# Patient Record
Sex: Male | Born: 1950 | Race: White | Hispanic: No | Marital: Married | Smoking: Former smoker
Health system: Southern US, Community
[De-identification: ages and names within clinical notes are randomized; demographics above are authoritative.]

## PROBLEM LIST (undated history)

## (undated) DIAGNOSIS — Z87442 Personal history of urinary calculi: Secondary | ICD-10-CM

## (undated) DIAGNOSIS — G4730 Sleep apnea, unspecified: Secondary | ICD-10-CM

## (undated) DIAGNOSIS — M1990 Unspecified osteoarthritis, unspecified site: Secondary | ICD-10-CM

## (undated) DIAGNOSIS — K219 Gastro-esophageal reflux disease without esophagitis: Secondary | ICD-10-CM

## (undated) DIAGNOSIS — E7800 Pure hypercholesterolemia, unspecified: Secondary | ICD-10-CM

## (undated) DIAGNOSIS — G20 Parkinson's disease: Secondary | ICD-10-CM

## (undated) HISTORY — PX: COLONOSCOPY: SHX174

---

## 2014-11-06 DIAGNOSIS — G20 Parkinson's disease: Secondary | ICD-10-CM | POA: Diagnosis not present

## 2014-12-07 DIAGNOSIS — F5221 Male erectile disorder: Secondary | ICD-10-CM | POA: Diagnosis not present

## 2014-12-07 DIAGNOSIS — E782 Mixed hyperlipidemia: Secondary | ICD-10-CM | POA: Diagnosis not present

## 2014-12-07 DIAGNOSIS — G20 Parkinson's disease: Secondary | ICD-10-CM | POA: Diagnosis not present

## 2014-12-07 DIAGNOSIS — K219 Gastro-esophageal reflux disease without esophagitis: Secondary | ICD-10-CM | POA: Diagnosis not present

## 2015-01-25 DIAGNOSIS — E782 Mixed hyperlipidemia: Secondary | ICD-10-CM | POA: Diagnosis not present

## 2015-01-25 DIAGNOSIS — K210 Gastro-esophageal reflux disease with esophagitis: Secondary | ICD-10-CM | POA: Diagnosis not present

## 2015-02-03 DIAGNOSIS — Z809 Family history of malignant neoplasm, unspecified: Secondary | ICD-10-CM | POA: Diagnosis not present

## 2015-02-03 DIAGNOSIS — Z87891 Personal history of nicotine dependence: Secondary | ICD-10-CM | POA: Diagnosis not present

## 2015-02-03 DIAGNOSIS — K219 Gastro-esophageal reflux disease without esophagitis: Secondary | ICD-10-CM | POA: Diagnosis not present

## 2015-02-03 DIAGNOSIS — Z7982 Long term (current) use of aspirin: Secondary | ICD-10-CM | POA: Diagnosis not present

## 2015-02-03 DIAGNOSIS — Z8249 Family history of ischemic heart disease and other diseases of the circulatory system: Secondary | ICD-10-CM | POA: Diagnosis not present

## 2015-02-03 DIAGNOSIS — Z6826 Body mass index (BMI) 26.0-26.9, adult: Secondary | ICD-10-CM | POA: Diagnosis not present

## 2015-02-03 DIAGNOSIS — G20 Parkinson's disease: Secondary | ICD-10-CM | POA: Diagnosis not present

## 2015-02-11 DIAGNOSIS — Z23 Encounter for immunization: Secondary | ICD-10-CM | POA: Diagnosis not present

## 2015-03-13 DIAGNOSIS — J019 Acute sinusitis, unspecified: Secondary | ICD-10-CM | POA: Diagnosis not present

## 2015-03-13 DIAGNOSIS — J069 Acute upper respiratory infection, unspecified: Secondary | ICD-10-CM | POA: Diagnosis not present

## 2015-04-14 DIAGNOSIS — G20 Parkinson's disease: Secondary | ICD-10-CM | POA: Diagnosis not present

## 2015-04-14 DIAGNOSIS — E782 Mixed hyperlipidemia: Secondary | ICD-10-CM | POA: Diagnosis not present

## 2015-04-14 DIAGNOSIS — K219 Gastro-esophageal reflux disease without esophagitis: Secondary | ICD-10-CM | POA: Diagnosis not present

## 2015-04-14 DIAGNOSIS — N183 Chronic kidney disease, stage 3 (moderate): Secondary | ICD-10-CM | POA: Diagnosis not present

## 2015-04-21 DIAGNOSIS — I517 Cardiomegaly: Secondary | ICD-10-CM | POA: Diagnosis not present

## 2015-04-21 DIAGNOSIS — Z87891 Personal history of nicotine dependence: Secondary | ICD-10-CM | POA: Diagnosis not present

## 2015-04-21 DIAGNOSIS — R0789 Other chest pain: Secondary | ICD-10-CM | POA: Diagnosis not present

## 2015-04-21 DIAGNOSIS — E7800 Pure hypercholesterolemia, unspecified: Secondary | ICD-10-CM | POA: Diagnosis not present

## 2015-05-25 DIAGNOSIS — J069 Acute upper respiratory infection, unspecified: Secondary | ICD-10-CM | POA: Diagnosis not present

## 2015-08-04 DIAGNOSIS — E7800 Pure hypercholesterolemia, unspecified: Secondary | ICD-10-CM | POA: Diagnosis not present

## 2015-08-04 DIAGNOSIS — Z87891 Personal history of nicotine dependence: Secondary | ICD-10-CM | POA: Diagnosis not present

## 2015-08-04 DIAGNOSIS — R0789 Other chest pain: Secondary | ICD-10-CM | POA: Diagnosis not present

## 2015-10-19 DIAGNOSIS — Z1212 Encounter for screening for malignant neoplasm of rectum: Secondary | ICD-10-CM | POA: Diagnosis not present

## 2015-10-19 DIAGNOSIS — K219 Gastro-esophageal reflux disease without esophagitis: Secondary | ICD-10-CM | POA: Diagnosis not present

## 2015-10-19 DIAGNOSIS — E782 Mixed hyperlipidemia: Secondary | ICD-10-CM | POA: Diagnosis not present

## 2015-10-19 DIAGNOSIS — G20 Parkinson's disease: Secondary | ICD-10-CM | POA: Diagnosis not present

## 2015-10-21 DIAGNOSIS — J069 Acute upper respiratory infection, unspecified: Secondary | ICD-10-CM | POA: Diagnosis not present

## 2016-03-01 DIAGNOSIS — G20 Parkinson's disease: Secondary | ICD-10-CM | POA: Diagnosis not present

## 2016-03-01 DIAGNOSIS — J0100 Acute maxillary sinusitis, unspecified: Secondary | ICD-10-CM | POA: Diagnosis not present

## 2016-03-01 DIAGNOSIS — Z1389 Encounter for screening for other disorder: Secondary | ICD-10-CM | POA: Diagnosis not present

## 2016-03-01 DIAGNOSIS — E782 Mixed hyperlipidemia: Secondary | ICD-10-CM | POA: Diagnosis not present

## 2016-03-01 DIAGNOSIS — Z6827 Body mass index (BMI) 27.0-27.9, adult: Secondary | ICD-10-CM | POA: Diagnosis not present

## 2016-03-01 DIAGNOSIS — Z23 Encounter for immunization: Secondary | ICD-10-CM | POA: Diagnosis not present

## 2016-03-01 DIAGNOSIS — S46011A Strain of muscle(s) and tendon(s) of the rotator cuff of right shoulder, initial encounter: Secondary | ICD-10-CM | POA: Diagnosis not present

## 2016-03-01 DIAGNOSIS — K219 Gastro-esophageal reflux disease without esophagitis: Secondary | ICD-10-CM | POA: Diagnosis not present

## 2016-07-14 DIAGNOSIS — J019 Acute sinusitis, unspecified: Secondary | ICD-10-CM | POA: Diagnosis not present

## 2016-07-14 DIAGNOSIS — J029 Acute pharyngitis, unspecified: Secondary | ICD-10-CM | POA: Diagnosis not present

## 2017-02-12 DIAGNOSIS — Z6827 Body mass index (BMI) 27.0-27.9, adult: Secondary | ICD-10-CM | POA: Diagnosis not present

## 2017-02-12 DIAGNOSIS — L03312 Cellulitis of back [any part except buttock]: Secondary | ICD-10-CM | POA: Diagnosis not present

## 2017-02-16 DIAGNOSIS — Z6826 Body mass index (BMI) 26.0-26.9, adult: Secondary | ICD-10-CM | POA: Diagnosis not present

## 2017-02-16 DIAGNOSIS — E782 Mixed hyperlipidemia: Secondary | ICD-10-CM | POA: Diagnosis not present

## 2017-02-16 DIAGNOSIS — G20 Parkinson's disease: Secondary | ICD-10-CM | POA: Diagnosis not present

## 2017-02-16 DIAGNOSIS — K210 Gastro-esophageal reflux disease with esophagitis: Secondary | ICD-10-CM | POA: Diagnosis not present

## 2017-02-16 DIAGNOSIS — Z0001 Encounter for general adult medical examination with abnormal findings: Secondary | ICD-10-CM | POA: Diagnosis not present

## 2017-02-20 DIAGNOSIS — E782 Mixed hyperlipidemia: Secondary | ICD-10-CM | POA: Diagnosis not present

## 2017-02-20 DIAGNOSIS — S46011A Strain of muscle(s) and tendon(s) of the rotator cuff of right shoulder, initial encounter: Secondary | ICD-10-CM | POA: Diagnosis not present

## 2017-02-20 DIAGNOSIS — Z23 Encounter for immunization: Secondary | ICD-10-CM | POA: Diagnosis not present

## 2017-02-20 DIAGNOSIS — Z1212 Encounter for screening for malignant neoplasm of rectum: Secondary | ICD-10-CM | POA: Diagnosis not present

## 2017-02-20 DIAGNOSIS — K219 Gastro-esophageal reflux disease without esophagitis: Secondary | ICD-10-CM | POA: Diagnosis not present

## 2017-02-20 DIAGNOSIS — G20 Parkinson's disease: Secondary | ICD-10-CM | POA: Diagnosis not present

## 2017-02-20 DIAGNOSIS — L570 Actinic keratosis: Secondary | ICD-10-CM | POA: Diagnosis not present

## 2017-02-20 DIAGNOSIS — Z0001 Encounter for general adult medical examination with abnormal findings: Secondary | ICD-10-CM | POA: Diagnosis not present

## 2017-02-23 DIAGNOSIS — L723 Sebaceous cyst: Secondary | ICD-10-CM | POA: Diagnosis not present

## 2017-08-13 DIAGNOSIS — G20 Parkinson's disease: Secondary | ICD-10-CM | POA: Diagnosis not present

## 2017-08-13 DIAGNOSIS — N183 Chronic kidney disease, stage 3 (moderate): Secondary | ICD-10-CM | POA: Diagnosis not present

## 2017-08-13 DIAGNOSIS — G4733 Obstructive sleep apnea (adult) (pediatric): Secondary | ICD-10-CM | POA: Diagnosis not present

## 2017-08-13 DIAGNOSIS — E782 Mixed hyperlipidemia: Secondary | ICD-10-CM | POA: Diagnosis not present

## 2017-08-13 DIAGNOSIS — Z9189 Other specified personal risk factors, not elsewhere classified: Secondary | ICD-10-CM | POA: Diagnosis not present

## 2017-08-13 DIAGNOSIS — K210 Gastro-esophageal reflux disease with esophagitis: Secondary | ICD-10-CM | POA: Diagnosis not present

## 2017-08-23 DIAGNOSIS — K219 Gastro-esophageal reflux disease without esophagitis: Secondary | ICD-10-CM | POA: Diagnosis not present

## 2017-08-23 DIAGNOSIS — E782 Mixed hyperlipidemia: Secondary | ICD-10-CM | POA: Diagnosis not present

## 2017-08-23 DIAGNOSIS — Z6827 Body mass index (BMI) 27.0-27.9, adult: Secondary | ICD-10-CM | POA: Diagnosis not present

## 2017-08-23 DIAGNOSIS — G20 Parkinson's disease: Secondary | ICD-10-CM | POA: Diagnosis not present

## 2017-08-23 DIAGNOSIS — L570 Actinic keratosis: Secondary | ICD-10-CM | POA: Diagnosis not present

## 2017-08-23 DIAGNOSIS — H9313 Tinnitus, bilateral: Secondary | ICD-10-CM | POA: Diagnosis not present

## 2017-08-26 DIAGNOSIS — S0501XA Injury of conjunctiva and corneal abrasion without foreign body, right eye, initial encounter: Secondary | ICD-10-CM | POA: Diagnosis not present

## 2017-08-26 DIAGNOSIS — Y998 Other external cause status: Secondary | ICD-10-CM | POA: Diagnosis not present

## 2017-09-20 DIAGNOSIS — Z6826 Body mass index (BMI) 26.0-26.9, adult: Secondary | ICD-10-CM | POA: Diagnosis not present

## 2017-09-20 DIAGNOSIS — S40022A Contusion of left upper arm, initial encounter: Secondary | ICD-10-CM | POA: Diagnosis not present

## 2018-01-25 DIAGNOSIS — R350 Frequency of micturition: Secondary | ICD-10-CM | POA: Diagnosis not present

## 2018-01-25 DIAGNOSIS — J0101 Acute recurrent maxillary sinusitis: Secondary | ICD-10-CM | POA: Diagnosis not present

## 2018-01-25 DIAGNOSIS — Z6826 Body mass index (BMI) 26.0-26.9, adult: Secondary | ICD-10-CM | POA: Diagnosis not present

## 2018-05-29 HISTORY — PX: ROTATOR CUFF REPAIR: SHX139

## 2018-09-09 DIAGNOSIS — M25512 Pain in left shoulder: Secondary | ICD-10-CM | POA: Diagnosis not present

## 2018-09-09 DIAGNOSIS — Z6826 Body mass index (BMI) 26.0-26.9, adult: Secondary | ICD-10-CM | POA: Diagnosis not present

## 2018-09-09 DIAGNOSIS — M7552 Bursitis of left shoulder: Secondary | ICD-10-CM | POA: Diagnosis not present

## 2018-09-19 DIAGNOSIS — S46812A Strain of other muscles, fascia and tendons at shoulder and upper arm level, left arm, initial encounter: Secondary | ICD-10-CM | POA: Diagnosis not present

## 2018-09-19 DIAGNOSIS — M75112 Incomplete rotator cuff tear or rupture of left shoulder, not specified as traumatic: Secondary | ICD-10-CM | POA: Diagnosis not present

## 2018-09-19 DIAGNOSIS — M7552 Bursitis of left shoulder: Secondary | ICD-10-CM | POA: Diagnosis not present

## 2018-09-19 DIAGNOSIS — M25512 Pain in left shoulder: Secondary | ICD-10-CM | POA: Diagnosis not present

## 2018-09-25 DIAGNOSIS — M25512 Pain in left shoulder: Secondary | ICD-10-CM | POA: Diagnosis not present

## 2018-09-30 DIAGNOSIS — M62512 Muscle wasting and atrophy, not elsewhere classified, left shoulder: Secondary | ICD-10-CM | POA: Diagnosis not present

## 2018-09-30 DIAGNOSIS — M25612 Stiffness of left shoulder, not elsewhere classified: Secondary | ICD-10-CM | POA: Diagnosis not present

## 2018-09-30 DIAGNOSIS — M25512 Pain in left shoulder: Secondary | ICD-10-CM | POA: Diagnosis not present

## 2018-10-02 DIAGNOSIS — M25612 Stiffness of left shoulder, not elsewhere classified: Secondary | ICD-10-CM | POA: Diagnosis not present

## 2018-10-02 DIAGNOSIS — M25512 Pain in left shoulder: Secondary | ICD-10-CM | POA: Diagnosis not present

## 2018-10-02 DIAGNOSIS — M62512 Muscle wasting and atrophy, not elsewhere classified, left shoulder: Secondary | ICD-10-CM | POA: Diagnosis not present

## 2018-10-08 DIAGNOSIS — M25512 Pain in left shoulder: Secondary | ICD-10-CM | POA: Diagnosis not present

## 2018-10-08 DIAGNOSIS — M25612 Stiffness of left shoulder, not elsewhere classified: Secondary | ICD-10-CM | POA: Diagnosis not present

## 2018-10-08 DIAGNOSIS — M62512 Muscle wasting and atrophy, not elsewhere classified, left shoulder: Secondary | ICD-10-CM | POA: Diagnosis not present

## 2018-10-10 DIAGNOSIS — M25512 Pain in left shoulder: Secondary | ICD-10-CM | POA: Diagnosis not present

## 2018-10-10 DIAGNOSIS — M25612 Stiffness of left shoulder, not elsewhere classified: Secondary | ICD-10-CM | POA: Diagnosis not present

## 2018-10-10 DIAGNOSIS — M62512 Muscle wasting and atrophy, not elsewhere classified, left shoulder: Secondary | ICD-10-CM | POA: Diagnosis not present

## 2018-10-15 DIAGNOSIS — M62512 Muscle wasting and atrophy, not elsewhere classified, left shoulder: Secondary | ICD-10-CM | POA: Diagnosis not present

## 2018-10-15 DIAGNOSIS — M25612 Stiffness of left shoulder, not elsewhere classified: Secondary | ICD-10-CM | POA: Diagnosis not present

## 2018-10-15 DIAGNOSIS — M25512 Pain in left shoulder: Secondary | ICD-10-CM | POA: Diagnosis not present

## 2018-10-17 DIAGNOSIS — M25612 Stiffness of left shoulder, not elsewhere classified: Secondary | ICD-10-CM | POA: Diagnosis not present

## 2018-10-17 DIAGNOSIS — M62512 Muscle wasting and atrophy, not elsewhere classified, left shoulder: Secondary | ICD-10-CM | POA: Diagnosis not present

## 2018-10-17 DIAGNOSIS — M25512 Pain in left shoulder: Secondary | ICD-10-CM | POA: Diagnosis not present

## 2018-10-25 DIAGNOSIS — M25512 Pain in left shoulder: Secondary | ICD-10-CM | POA: Diagnosis not present

## 2018-10-29 DIAGNOSIS — M25612 Stiffness of left shoulder, not elsewhere classified: Secondary | ICD-10-CM | POA: Diagnosis not present

## 2018-10-29 DIAGNOSIS — M62512 Muscle wasting and atrophy, not elsewhere classified, left shoulder: Secondary | ICD-10-CM | POA: Diagnosis not present

## 2018-10-31 DIAGNOSIS — M25612 Stiffness of left shoulder, not elsewhere classified: Secondary | ICD-10-CM | POA: Diagnosis not present

## 2018-10-31 DIAGNOSIS — M62512 Muscle wasting and atrophy, not elsewhere classified, left shoulder: Secondary | ICD-10-CM | POA: Diagnosis not present

## 2018-11-07 DIAGNOSIS — M62512 Muscle wasting and atrophy, not elsewhere classified, left shoulder: Secondary | ICD-10-CM | POA: Diagnosis not present

## 2018-11-07 DIAGNOSIS — M25612 Stiffness of left shoulder, not elsewhere classified: Secondary | ICD-10-CM | POA: Diagnosis not present

## 2018-11-12 DIAGNOSIS — M62512 Muscle wasting and atrophy, not elsewhere classified, left shoulder: Secondary | ICD-10-CM | POA: Diagnosis not present

## 2018-11-12 DIAGNOSIS — M25612 Stiffness of left shoulder, not elsewhere classified: Secondary | ICD-10-CM | POA: Diagnosis not present

## 2018-11-14 DIAGNOSIS — M62512 Muscle wasting and atrophy, not elsewhere classified, left shoulder: Secondary | ICD-10-CM | POA: Diagnosis not present

## 2018-11-14 DIAGNOSIS — M25612 Stiffness of left shoulder, not elsewhere classified: Secondary | ICD-10-CM | POA: Diagnosis not present

## 2018-11-19 DIAGNOSIS — M25612 Stiffness of left shoulder, not elsewhere classified: Secondary | ICD-10-CM | POA: Diagnosis not present

## 2018-11-19 DIAGNOSIS — M62512 Muscle wasting and atrophy, not elsewhere classified, left shoulder: Secondary | ICD-10-CM | POA: Diagnosis not present

## 2018-11-21 DIAGNOSIS — M25612 Stiffness of left shoulder, not elsewhere classified: Secondary | ICD-10-CM | POA: Diagnosis not present

## 2018-11-21 DIAGNOSIS — M62512 Muscle wasting and atrophy, not elsewhere classified, left shoulder: Secondary | ICD-10-CM | POA: Diagnosis not present

## 2018-12-04 DIAGNOSIS — M62512 Muscle wasting and atrophy, not elsewhere classified, left shoulder: Secondary | ICD-10-CM | POA: Diagnosis not present

## 2018-12-04 DIAGNOSIS — M25512 Pain in left shoulder: Secondary | ICD-10-CM | POA: Diagnosis not present

## 2018-12-04 DIAGNOSIS — M25612 Stiffness of left shoulder, not elsewhere classified: Secondary | ICD-10-CM | POA: Diagnosis not present

## 2018-12-06 DIAGNOSIS — M25612 Stiffness of left shoulder, not elsewhere classified: Secondary | ICD-10-CM | POA: Diagnosis not present

## 2018-12-06 DIAGNOSIS — M25512 Pain in left shoulder: Secondary | ICD-10-CM | POA: Diagnosis not present

## 2018-12-06 DIAGNOSIS — M62512 Muscle wasting and atrophy, not elsewhere classified, left shoulder: Secondary | ICD-10-CM | POA: Diagnosis not present

## 2018-12-09 DIAGNOSIS — M25512 Pain in left shoulder: Secondary | ICD-10-CM | POA: Diagnosis not present

## 2018-12-17 DIAGNOSIS — M25512 Pain in left shoulder: Secondary | ICD-10-CM | POA: Diagnosis not present

## 2018-12-17 DIAGNOSIS — M25612 Stiffness of left shoulder, not elsewhere classified: Secondary | ICD-10-CM | POA: Diagnosis not present

## 2018-12-17 DIAGNOSIS — M62512 Muscle wasting and atrophy, not elsewhere classified, left shoulder: Secondary | ICD-10-CM | POA: Diagnosis not present

## 2018-12-19 DIAGNOSIS — M25612 Stiffness of left shoulder, not elsewhere classified: Secondary | ICD-10-CM | POA: Diagnosis not present

## 2018-12-19 DIAGNOSIS — M25512 Pain in left shoulder: Secondary | ICD-10-CM | POA: Diagnosis not present

## 2018-12-19 DIAGNOSIS — M62512 Muscle wasting and atrophy, not elsewhere classified, left shoulder: Secondary | ICD-10-CM | POA: Diagnosis not present

## 2018-12-24 DIAGNOSIS — M62512 Muscle wasting and atrophy, not elsewhere classified, left shoulder: Secondary | ICD-10-CM | POA: Diagnosis not present

## 2018-12-24 DIAGNOSIS — M25612 Stiffness of left shoulder, not elsewhere classified: Secondary | ICD-10-CM | POA: Diagnosis not present

## 2018-12-24 DIAGNOSIS — M25512 Pain in left shoulder: Secondary | ICD-10-CM | POA: Diagnosis not present

## 2018-12-26 DIAGNOSIS — M62512 Muscle wasting and atrophy, not elsewhere classified, left shoulder: Secondary | ICD-10-CM | POA: Diagnosis not present

## 2018-12-26 DIAGNOSIS — M25612 Stiffness of left shoulder, not elsewhere classified: Secondary | ICD-10-CM | POA: Diagnosis not present

## 2018-12-26 DIAGNOSIS — M25512 Pain in left shoulder: Secondary | ICD-10-CM | POA: Diagnosis not present

## 2019-01-02 DIAGNOSIS — M25512 Pain in left shoulder: Secondary | ICD-10-CM | POA: Diagnosis not present

## 2019-01-02 DIAGNOSIS — M62512 Muscle wasting and atrophy, not elsewhere classified, left shoulder: Secondary | ICD-10-CM | POA: Diagnosis not present

## 2019-01-02 DIAGNOSIS — M25612 Stiffness of left shoulder, not elsewhere classified: Secondary | ICD-10-CM | POA: Diagnosis not present

## 2019-01-06 DIAGNOSIS — M25512 Pain in left shoulder: Secondary | ICD-10-CM | POA: Diagnosis not present

## 2019-01-07 DIAGNOSIS — M25612 Stiffness of left shoulder, not elsewhere classified: Secondary | ICD-10-CM | POA: Diagnosis not present

## 2019-01-07 DIAGNOSIS — M25512 Pain in left shoulder: Secondary | ICD-10-CM | POA: Diagnosis not present

## 2019-01-07 DIAGNOSIS — M62512 Muscle wasting and atrophy, not elsewhere classified, left shoulder: Secondary | ICD-10-CM | POA: Diagnosis not present

## 2019-01-09 DIAGNOSIS — M25512 Pain in left shoulder: Secondary | ICD-10-CM | POA: Diagnosis not present

## 2019-01-09 DIAGNOSIS — M62512 Muscle wasting and atrophy, not elsewhere classified, left shoulder: Secondary | ICD-10-CM | POA: Diagnosis not present

## 2019-01-09 DIAGNOSIS — M25612 Stiffness of left shoulder, not elsewhere classified: Secondary | ICD-10-CM | POA: Diagnosis not present

## 2019-01-14 DIAGNOSIS — M25612 Stiffness of left shoulder, not elsewhere classified: Secondary | ICD-10-CM | POA: Diagnosis not present

## 2019-01-14 DIAGNOSIS — M62512 Muscle wasting and atrophy, not elsewhere classified, left shoulder: Secondary | ICD-10-CM | POA: Diagnosis not present

## 2019-01-14 DIAGNOSIS — M25512 Pain in left shoulder: Secondary | ICD-10-CM | POA: Diagnosis not present

## 2019-01-16 DIAGNOSIS — M62512 Muscle wasting and atrophy, not elsewhere classified, left shoulder: Secondary | ICD-10-CM | POA: Diagnosis not present

## 2019-01-16 DIAGNOSIS — M25612 Stiffness of left shoulder, not elsewhere classified: Secondary | ICD-10-CM | POA: Diagnosis not present

## 2019-01-16 DIAGNOSIS — M25512 Pain in left shoulder: Secondary | ICD-10-CM | POA: Diagnosis not present

## 2019-01-21 DIAGNOSIS — K210 Gastro-esophageal reflux disease with esophagitis: Secondary | ICD-10-CM | POA: Diagnosis not present

## 2019-01-21 DIAGNOSIS — E782 Mixed hyperlipidemia: Secondary | ICD-10-CM | POA: Diagnosis not present

## 2019-01-21 DIAGNOSIS — G4733 Obstructive sleep apnea (adult) (pediatric): Secondary | ICD-10-CM | POA: Diagnosis not present

## 2019-01-21 DIAGNOSIS — N183 Chronic kidney disease, stage 3 (moderate): Secondary | ICD-10-CM | POA: Diagnosis not present

## 2019-01-21 DIAGNOSIS — G20 Parkinson's disease: Secondary | ICD-10-CM | POA: Diagnosis not present

## 2019-01-29 DIAGNOSIS — G20 Parkinson's disease: Secondary | ICD-10-CM | POA: Diagnosis not present

## 2019-01-29 DIAGNOSIS — K219 Gastro-esophageal reflux disease without esophagitis: Secondary | ICD-10-CM | POA: Diagnosis not present

## 2019-01-29 DIAGNOSIS — Z23 Encounter for immunization: Secondary | ICD-10-CM | POA: Diagnosis not present

## 2019-01-29 DIAGNOSIS — H9313 Tinnitus, bilateral: Secondary | ICD-10-CM | POA: Diagnosis not present

## 2019-01-29 DIAGNOSIS — Z1212 Encounter for screening for malignant neoplasm of rectum: Secondary | ICD-10-CM | POA: Diagnosis not present

## 2019-01-29 DIAGNOSIS — Z6826 Body mass index (BMI) 26.0-26.9, adult: Secondary | ICD-10-CM | POA: Diagnosis not present

## 2019-01-29 DIAGNOSIS — E782 Mixed hyperlipidemia: Secondary | ICD-10-CM | POA: Diagnosis not present

## 2019-01-29 DIAGNOSIS — Z0001 Encounter for general adult medical examination with abnormal findings: Secondary | ICD-10-CM | POA: Diagnosis not present

## 2019-02-17 DIAGNOSIS — M9901 Segmental and somatic dysfunction of cervical region: Secondary | ICD-10-CM | POA: Diagnosis not present

## 2019-02-17 DIAGNOSIS — M9902 Segmental and somatic dysfunction of thoracic region: Secondary | ICD-10-CM | POA: Diagnosis not present

## 2019-02-20 DIAGNOSIS — M9901 Segmental and somatic dysfunction of cervical region: Secondary | ICD-10-CM | POA: Diagnosis not present

## 2019-02-20 DIAGNOSIS — M9902 Segmental and somatic dysfunction of thoracic region: Secondary | ICD-10-CM | POA: Diagnosis not present

## 2019-02-24 DIAGNOSIS — M9901 Segmental and somatic dysfunction of cervical region: Secondary | ICD-10-CM | POA: Diagnosis not present

## 2019-02-24 DIAGNOSIS — M9902 Segmental and somatic dysfunction of thoracic region: Secondary | ICD-10-CM | POA: Diagnosis not present

## 2019-02-27 DIAGNOSIS — M9902 Segmental and somatic dysfunction of thoracic region: Secondary | ICD-10-CM | POA: Diagnosis not present

## 2019-02-27 DIAGNOSIS — M9901 Segmental and somatic dysfunction of cervical region: Secondary | ICD-10-CM | POA: Diagnosis not present

## 2019-03-03 DIAGNOSIS — M9901 Segmental and somatic dysfunction of cervical region: Secondary | ICD-10-CM | POA: Diagnosis not present

## 2019-03-03 DIAGNOSIS — M9902 Segmental and somatic dysfunction of thoracic region: Secondary | ICD-10-CM | POA: Diagnosis not present

## 2019-03-06 DIAGNOSIS — M9902 Segmental and somatic dysfunction of thoracic region: Secondary | ICD-10-CM | POA: Diagnosis not present

## 2019-03-06 DIAGNOSIS — M9901 Segmental and somatic dysfunction of cervical region: Secondary | ICD-10-CM | POA: Diagnosis not present

## 2019-03-10 DIAGNOSIS — M9902 Segmental and somatic dysfunction of thoracic region: Secondary | ICD-10-CM | POA: Diagnosis not present

## 2019-03-10 DIAGNOSIS — M9901 Segmental and somatic dysfunction of cervical region: Secondary | ICD-10-CM | POA: Diagnosis not present

## 2019-03-13 DIAGNOSIS — M9902 Segmental and somatic dysfunction of thoracic region: Secondary | ICD-10-CM | POA: Diagnosis not present

## 2019-03-13 DIAGNOSIS — M9901 Segmental and somatic dysfunction of cervical region: Secondary | ICD-10-CM | POA: Diagnosis not present

## 2019-03-14 DIAGNOSIS — G5602 Carpal tunnel syndrome, left upper limb: Secondary | ICD-10-CM | POA: Diagnosis not present

## 2019-03-14 DIAGNOSIS — M25512 Pain in left shoulder: Secondary | ICD-10-CM | POA: Diagnosis not present

## 2019-03-25 DIAGNOSIS — L570 Actinic keratosis: Secondary | ICD-10-CM | POA: Diagnosis not present

## 2019-04-16 DIAGNOSIS — S4382XA Sprain of other specified parts of left shoulder girdle, initial encounter: Secondary | ICD-10-CM | POA: Diagnosis not present

## 2019-04-21 DIAGNOSIS — G20 Parkinson's disease: Secondary | ICD-10-CM | POA: Diagnosis not present

## 2019-04-21 DIAGNOSIS — H9313 Tinnitus, bilateral: Secondary | ICD-10-CM | POA: Diagnosis not present

## 2019-04-21 DIAGNOSIS — E782 Mixed hyperlipidemia: Secondary | ICD-10-CM | POA: Diagnosis not present

## 2019-04-21 DIAGNOSIS — L570 Actinic keratosis: Secondary | ICD-10-CM | POA: Diagnosis not present

## 2019-04-21 DIAGNOSIS — K219 Gastro-esophageal reflux disease without esophagitis: Secondary | ICD-10-CM | POA: Diagnosis not present

## 2019-04-28 DIAGNOSIS — E782 Mixed hyperlipidemia: Secondary | ICD-10-CM | POA: Diagnosis not present

## 2019-04-28 DIAGNOSIS — K219 Gastro-esophageal reflux disease without esophagitis: Secondary | ICD-10-CM | POA: Diagnosis not present

## 2019-05-08 DIAGNOSIS — M75122 Complete rotator cuff tear or rupture of left shoulder, not specified as traumatic: Secondary | ICD-10-CM | POA: Diagnosis not present

## 2019-05-08 DIAGNOSIS — S43432A Superior glenoid labrum lesion of left shoulder, initial encounter: Secondary | ICD-10-CM | POA: Diagnosis not present

## 2019-05-08 DIAGNOSIS — G8918 Other acute postprocedural pain: Secondary | ICD-10-CM | POA: Diagnosis not present

## 2019-05-08 DIAGNOSIS — S46012A Strain of muscle(s) and tendon(s) of the rotator cuff of left shoulder, initial encounter: Secondary | ICD-10-CM | POA: Diagnosis not present

## 2019-05-08 DIAGNOSIS — M24112 Other articular cartilage disorders, left shoulder: Secondary | ICD-10-CM | POA: Diagnosis not present

## 2019-05-08 DIAGNOSIS — M948X1 Other specified disorders of cartilage, shoulder: Secondary | ICD-10-CM | POA: Diagnosis not present

## 2019-05-08 DIAGNOSIS — M7522 Bicipital tendinitis, left shoulder: Secondary | ICD-10-CM | POA: Diagnosis not present

## 2019-05-08 DIAGNOSIS — M7542 Impingement syndrome of left shoulder: Secondary | ICD-10-CM | POA: Diagnosis not present

## 2019-05-19 DIAGNOSIS — M24112 Other articular cartilage disorders, left shoulder: Secondary | ICD-10-CM | POA: Diagnosis not present

## 2019-05-29 DIAGNOSIS — K219 Gastro-esophageal reflux disease without esophagitis: Secondary | ICD-10-CM | POA: Diagnosis not present

## 2019-05-29 DIAGNOSIS — N183 Chronic kidney disease, stage 3 unspecified: Secondary | ICD-10-CM | POA: Diagnosis not present

## 2019-05-29 DIAGNOSIS — G20 Parkinson's disease: Secondary | ICD-10-CM | POA: Diagnosis not present

## 2019-05-29 DIAGNOSIS — E782 Mixed hyperlipidemia: Secondary | ICD-10-CM | POA: Diagnosis not present

## 2019-06-03 DIAGNOSIS — K219 Gastro-esophageal reflux disease without esophagitis: Secondary | ICD-10-CM | POA: Diagnosis not present

## 2019-06-03 DIAGNOSIS — Z6826 Body mass index (BMI) 26.0-26.9, adult: Secondary | ICD-10-CM | POA: Diagnosis not present

## 2019-06-03 DIAGNOSIS — G20 Parkinson's disease: Secondary | ICD-10-CM | POA: Diagnosis not present

## 2019-06-03 DIAGNOSIS — Z23 Encounter for immunization: Secondary | ICD-10-CM | POA: Diagnosis not present

## 2019-06-03 DIAGNOSIS — E782 Mixed hyperlipidemia: Secondary | ICD-10-CM | POA: Diagnosis not present

## 2019-06-03 DIAGNOSIS — H9313 Tinnitus, bilateral: Secondary | ICD-10-CM | POA: Diagnosis not present

## 2019-06-18 DIAGNOSIS — M25511 Pain in right shoulder: Secondary | ICD-10-CM | POA: Diagnosis not present

## 2019-06-18 DIAGNOSIS — Z4789 Encounter for other orthopedic aftercare: Secondary | ICD-10-CM | POA: Diagnosis not present

## 2019-06-20 DIAGNOSIS — M25511 Pain in right shoulder: Secondary | ICD-10-CM | POA: Diagnosis not present

## 2019-06-20 DIAGNOSIS — Z4789 Encounter for other orthopedic aftercare: Secondary | ICD-10-CM | POA: Diagnosis not present

## 2019-06-25 DIAGNOSIS — Z4789 Encounter for other orthopedic aftercare: Secondary | ICD-10-CM | POA: Diagnosis not present

## 2019-06-25 DIAGNOSIS — M25511 Pain in right shoulder: Secondary | ICD-10-CM | POA: Diagnosis not present

## 2019-06-27 DIAGNOSIS — E7849 Other hyperlipidemia: Secondary | ICD-10-CM | POA: Diagnosis not present

## 2019-06-27 DIAGNOSIS — M25511 Pain in right shoulder: Secondary | ICD-10-CM | POA: Diagnosis not present

## 2019-06-27 DIAGNOSIS — Z4789 Encounter for other orthopedic aftercare: Secondary | ICD-10-CM | POA: Diagnosis not present

## 2019-06-27 DIAGNOSIS — G20 Parkinson's disease: Secondary | ICD-10-CM | POA: Diagnosis not present

## 2019-07-02 DIAGNOSIS — Z4789 Encounter for other orthopedic aftercare: Secondary | ICD-10-CM | POA: Diagnosis not present

## 2019-07-02 DIAGNOSIS — M25511 Pain in right shoulder: Secondary | ICD-10-CM | POA: Diagnosis not present

## 2019-07-04 DIAGNOSIS — M25511 Pain in right shoulder: Secondary | ICD-10-CM | POA: Diagnosis not present

## 2019-07-04 DIAGNOSIS — Z4789 Encounter for other orthopedic aftercare: Secondary | ICD-10-CM | POA: Diagnosis not present

## 2019-07-09 DIAGNOSIS — M25511 Pain in right shoulder: Secondary | ICD-10-CM | POA: Diagnosis not present

## 2019-07-09 DIAGNOSIS — Z4789 Encounter for other orthopedic aftercare: Secondary | ICD-10-CM | POA: Diagnosis not present

## 2019-07-11 DIAGNOSIS — Z4789 Encounter for other orthopedic aftercare: Secondary | ICD-10-CM | POA: Diagnosis not present

## 2019-07-11 DIAGNOSIS — M25511 Pain in right shoulder: Secondary | ICD-10-CM | POA: Diagnosis not present

## 2019-07-16 DIAGNOSIS — Z4789 Encounter for other orthopedic aftercare: Secondary | ICD-10-CM | POA: Diagnosis not present

## 2019-07-16 DIAGNOSIS — M25511 Pain in right shoulder: Secondary | ICD-10-CM | POA: Diagnosis not present

## 2019-07-18 DIAGNOSIS — Z4789 Encounter for other orthopedic aftercare: Secondary | ICD-10-CM | POA: Diagnosis not present

## 2019-07-18 DIAGNOSIS — M25511 Pain in right shoulder: Secondary | ICD-10-CM | POA: Diagnosis not present

## 2019-07-23 DIAGNOSIS — Z4789 Encounter for other orthopedic aftercare: Secondary | ICD-10-CM | POA: Diagnosis not present

## 2019-07-23 DIAGNOSIS — M25511 Pain in right shoulder: Secondary | ICD-10-CM | POA: Diagnosis not present

## 2019-07-25 DIAGNOSIS — M25511 Pain in right shoulder: Secondary | ICD-10-CM | POA: Diagnosis not present

## 2019-07-25 DIAGNOSIS — Z4789 Encounter for other orthopedic aftercare: Secondary | ICD-10-CM | POA: Diagnosis not present

## 2019-07-30 DIAGNOSIS — Z4789 Encounter for other orthopedic aftercare: Secondary | ICD-10-CM | POA: Diagnosis not present

## 2019-07-30 DIAGNOSIS — M25511 Pain in right shoulder: Secondary | ICD-10-CM | POA: Diagnosis not present

## 2019-08-01 DIAGNOSIS — M25511 Pain in right shoulder: Secondary | ICD-10-CM | POA: Diagnosis not present

## 2019-08-01 DIAGNOSIS — Z4789 Encounter for other orthopedic aftercare: Secondary | ICD-10-CM | POA: Diagnosis not present

## 2019-08-06 DIAGNOSIS — M25511 Pain in right shoulder: Secondary | ICD-10-CM | POA: Diagnosis not present

## 2019-08-06 DIAGNOSIS — Z4789 Encounter for other orthopedic aftercare: Secondary | ICD-10-CM | POA: Diagnosis not present

## 2019-08-08 DIAGNOSIS — Z4789 Encounter for other orthopedic aftercare: Secondary | ICD-10-CM | POA: Diagnosis not present

## 2019-08-08 DIAGNOSIS — M25512 Pain in left shoulder: Secondary | ICD-10-CM | POA: Diagnosis not present

## 2019-08-11 DIAGNOSIS — S46012D Strain of muscle(s) and tendon(s) of the rotator cuff of left shoulder, subsequent encounter: Secondary | ICD-10-CM | POA: Diagnosis not present

## 2019-08-13 DIAGNOSIS — Z4789 Encounter for other orthopedic aftercare: Secondary | ICD-10-CM | POA: Diagnosis not present

## 2019-08-13 DIAGNOSIS — M25512 Pain in left shoulder: Secondary | ICD-10-CM | POA: Diagnosis not present

## 2019-08-15 DIAGNOSIS — Z4789 Encounter for other orthopedic aftercare: Secondary | ICD-10-CM | POA: Diagnosis not present

## 2019-08-15 DIAGNOSIS — M25512 Pain in left shoulder: Secondary | ICD-10-CM | POA: Diagnosis not present

## 2019-08-20 DIAGNOSIS — Z4789 Encounter for other orthopedic aftercare: Secondary | ICD-10-CM | POA: Diagnosis not present

## 2019-08-20 DIAGNOSIS — M25512 Pain in left shoulder: Secondary | ICD-10-CM | POA: Diagnosis not present

## 2019-08-22 DIAGNOSIS — Z4789 Encounter for other orthopedic aftercare: Secondary | ICD-10-CM | POA: Diagnosis not present

## 2019-08-22 DIAGNOSIS — M25512 Pain in left shoulder: Secondary | ICD-10-CM | POA: Diagnosis not present

## 2019-08-27 DIAGNOSIS — G20 Parkinson's disease: Secondary | ICD-10-CM | POA: Diagnosis not present

## 2019-08-27 DIAGNOSIS — E7849 Other hyperlipidemia: Secondary | ICD-10-CM | POA: Diagnosis not present

## 2019-08-29 DIAGNOSIS — Z4789 Encounter for other orthopedic aftercare: Secondary | ICD-10-CM | POA: Diagnosis not present

## 2019-08-29 DIAGNOSIS — M25512 Pain in left shoulder: Secondary | ICD-10-CM | POA: Diagnosis not present

## 2019-09-03 DIAGNOSIS — M25512 Pain in left shoulder: Secondary | ICD-10-CM | POA: Diagnosis not present

## 2019-09-03 DIAGNOSIS — Z4789 Encounter for other orthopedic aftercare: Secondary | ICD-10-CM | POA: Diagnosis not present

## 2019-09-05 DIAGNOSIS — Z4789 Encounter for other orthopedic aftercare: Secondary | ICD-10-CM | POA: Diagnosis not present

## 2019-09-05 DIAGNOSIS — M25512 Pain in left shoulder: Secondary | ICD-10-CM | POA: Diagnosis not present

## 2019-09-10 DIAGNOSIS — M25512 Pain in left shoulder: Secondary | ICD-10-CM | POA: Diagnosis not present

## 2019-09-10 DIAGNOSIS — Z4789 Encounter for other orthopedic aftercare: Secondary | ICD-10-CM | POA: Diagnosis not present

## 2019-09-12 DIAGNOSIS — Z4789 Encounter for other orthopedic aftercare: Secondary | ICD-10-CM | POA: Diagnosis not present

## 2019-09-12 DIAGNOSIS — M25512 Pain in left shoulder: Secondary | ICD-10-CM | POA: Diagnosis not present

## 2019-09-15 DIAGNOSIS — S46012D Strain of muscle(s) and tendon(s) of the rotator cuff of left shoulder, subsequent encounter: Secondary | ICD-10-CM | POA: Diagnosis not present

## 2019-09-15 DIAGNOSIS — M7522 Bicipital tendinitis, left shoulder: Secondary | ICD-10-CM | POA: Diagnosis not present

## 2019-09-18 DIAGNOSIS — Z4789 Encounter for other orthopedic aftercare: Secondary | ICD-10-CM | POA: Diagnosis not present

## 2019-09-18 DIAGNOSIS — M25512 Pain in left shoulder: Secondary | ICD-10-CM | POA: Diagnosis not present

## 2019-09-24 DIAGNOSIS — Z4789 Encounter for other orthopedic aftercare: Secondary | ICD-10-CM | POA: Diagnosis not present

## 2019-09-24 DIAGNOSIS — M25512 Pain in left shoulder: Secondary | ICD-10-CM | POA: Diagnosis not present

## 2019-09-26 DIAGNOSIS — G20 Parkinson's disease: Secondary | ICD-10-CM | POA: Diagnosis not present

## 2019-09-26 DIAGNOSIS — M25512 Pain in left shoulder: Secondary | ICD-10-CM | POA: Diagnosis not present

## 2019-09-26 DIAGNOSIS — E7849 Other hyperlipidemia: Secondary | ICD-10-CM | POA: Diagnosis not present

## 2019-09-26 DIAGNOSIS — Z4789 Encounter for other orthopedic aftercare: Secondary | ICD-10-CM | POA: Diagnosis not present

## 2019-10-01 DIAGNOSIS — Z4789 Encounter for other orthopedic aftercare: Secondary | ICD-10-CM | POA: Diagnosis not present

## 2019-10-01 DIAGNOSIS — M25512 Pain in left shoulder: Secondary | ICD-10-CM | POA: Diagnosis not present

## 2019-10-03 DIAGNOSIS — Z4789 Encounter for other orthopedic aftercare: Secondary | ICD-10-CM | POA: Diagnosis not present

## 2019-10-03 DIAGNOSIS — M25512 Pain in left shoulder: Secondary | ICD-10-CM | POA: Diagnosis not present

## 2019-10-08 DIAGNOSIS — Z4789 Encounter for other orthopedic aftercare: Secondary | ICD-10-CM | POA: Diagnosis not present

## 2019-10-08 DIAGNOSIS — M25512 Pain in left shoulder: Secondary | ICD-10-CM | POA: Diagnosis not present

## 2019-10-10 DIAGNOSIS — M25512 Pain in left shoulder: Secondary | ICD-10-CM | POA: Diagnosis not present

## 2019-10-10 DIAGNOSIS — Z4789 Encounter for other orthopedic aftercare: Secondary | ICD-10-CM | POA: Diagnosis not present

## 2019-10-15 DIAGNOSIS — Z4789 Encounter for other orthopedic aftercare: Secondary | ICD-10-CM | POA: Diagnosis not present

## 2019-10-15 DIAGNOSIS — M25512 Pain in left shoulder: Secondary | ICD-10-CM | POA: Diagnosis not present

## 2019-10-18 DIAGNOSIS — G20 Parkinson's disease: Secondary | ICD-10-CM | POA: Diagnosis not present

## 2019-10-18 DIAGNOSIS — R300 Dysuria: Secondary | ICD-10-CM | POA: Diagnosis not present

## 2019-10-18 DIAGNOSIS — Z6825 Body mass index (BMI) 25.0-25.9, adult: Secondary | ICD-10-CM | POA: Diagnosis not present

## 2019-11-01 DIAGNOSIS — N23 Unspecified renal colic: Secondary | ICD-10-CM | POA: Diagnosis not present

## 2019-11-01 DIAGNOSIS — R1110 Vomiting, unspecified: Secondary | ICD-10-CM | POA: Diagnosis not present

## 2019-11-01 DIAGNOSIS — N132 Hydronephrosis with renal and ureteral calculous obstruction: Secondary | ICD-10-CM | POA: Diagnosis not present

## 2019-11-01 DIAGNOSIS — E7800 Pure hypercholesterolemia, unspecified: Secondary | ICD-10-CM | POA: Diagnosis not present

## 2019-11-01 DIAGNOSIS — N200 Calculus of kidney: Secondary | ICD-10-CM | POA: Diagnosis not present

## 2019-11-01 DIAGNOSIS — G20 Parkinson's disease: Secondary | ICD-10-CM | POA: Diagnosis not present

## 2019-11-10 DIAGNOSIS — G4733 Obstructive sleep apnea (adult) (pediatric): Secondary | ICD-10-CM | POA: Diagnosis not present

## 2019-11-10 DIAGNOSIS — K219 Gastro-esophageal reflux disease without esophagitis: Secondary | ICD-10-CM | POA: Diagnosis not present

## 2019-11-10 DIAGNOSIS — E782 Mixed hyperlipidemia: Secondary | ICD-10-CM | POA: Diagnosis not present

## 2019-11-10 DIAGNOSIS — Z1212 Encounter for screening for malignant neoplasm of rectum: Secondary | ICD-10-CM | POA: Diagnosis not present

## 2019-11-21 DIAGNOSIS — G4733 Obstructive sleep apnea (adult) (pediatric): Secondary | ICD-10-CM | POA: Diagnosis not present

## 2019-11-24 DIAGNOSIS — S46012D Strain of muscle(s) and tendon(s) of the rotator cuff of left shoulder, subsequent encounter: Secondary | ICD-10-CM | POA: Diagnosis not present

## 2019-11-26 DIAGNOSIS — N183 Chronic kidney disease, stage 3 unspecified: Secondary | ICD-10-CM | POA: Diagnosis not present

## 2019-11-26 DIAGNOSIS — G20 Parkinson's disease: Secondary | ICD-10-CM | POA: Diagnosis not present

## 2019-11-26 DIAGNOSIS — E7849 Other hyperlipidemia: Secondary | ICD-10-CM | POA: Diagnosis not present

## 2019-11-26 DIAGNOSIS — F0280 Dementia in other diseases classified elsewhere without behavioral disturbance: Secondary | ICD-10-CM | POA: Diagnosis not present

## 2019-12-05 DIAGNOSIS — R351 Nocturia: Secondary | ICD-10-CM | POA: Diagnosis not present

## 2019-12-05 DIAGNOSIS — N281 Cyst of kidney, acquired: Secondary | ICD-10-CM | POA: Diagnosis not present

## 2019-12-05 DIAGNOSIS — N132 Hydronephrosis with renal and ureteral calculous obstruction: Secondary | ICD-10-CM | POA: Diagnosis not present

## 2019-12-05 DIAGNOSIS — R1031 Right lower quadrant pain: Secondary | ICD-10-CM | POA: Diagnosis not present

## 2019-12-05 DIAGNOSIS — R3912 Poor urinary stream: Secondary | ICD-10-CM | POA: Diagnosis not present

## 2019-12-05 DIAGNOSIS — R3914 Feeling of incomplete bladder emptying: Secondary | ICD-10-CM | POA: Diagnosis not present

## 2019-12-21 DIAGNOSIS — G4733 Obstructive sleep apnea (adult) (pediatric): Secondary | ICD-10-CM | POA: Diagnosis not present

## 2019-12-26 DIAGNOSIS — G20 Parkinson's disease: Secondary | ICD-10-CM | POA: Diagnosis not present

## 2019-12-26 DIAGNOSIS — E7849 Other hyperlipidemia: Secondary | ICD-10-CM | POA: Diagnosis not present

## 2019-12-26 DIAGNOSIS — F0280 Dementia in other diseases classified elsewhere without behavioral disturbance: Secondary | ICD-10-CM | POA: Diagnosis not present

## 2019-12-26 DIAGNOSIS — N183 Chronic kidney disease, stage 3 unspecified: Secondary | ICD-10-CM | POA: Diagnosis not present

## 2020-01-12 DIAGNOSIS — R0981 Nasal congestion: Secondary | ICD-10-CM | POA: Diagnosis not present

## 2020-01-12 DIAGNOSIS — J988 Other specified respiratory disorders: Secondary | ICD-10-CM | POA: Diagnosis not present

## 2020-01-12 DIAGNOSIS — Z20828 Contact with and (suspected) exposure to other viral communicable diseases: Secondary | ICD-10-CM | POA: Diagnosis not present

## 2020-01-12 DIAGNOSIS — B9789 Other viral agents as the cause of diseases classified elsewhere: Secondary | ICD-10-CM | POA: Diagnosis not present

## 2020-01-21 DIAGNOSIS — G4733 Obstructive sleep apnea (adult) (pediatric): Secondary | ICD-10-CM | POA: Diagnosis not present

## 2020-01-27 DIAGNOSIS — E7849 Other hyperlipidemia: Secondary | ICD-10-CM | POA: Diagnosis not present

## 2020-01-27 DIAGNOSIS — G20 Parkinson's disease: Secondary | ICD-10-CM | POA: Diagnosis not present

## 2020-01-27 DIAGNOSIS — N183 Chronic kidney disease, stage 3 unspecified: Secondary | ICD-10-CM | POA: Diagnosis not present

## 2020-01-27 DIAGNOSIS — F0280 Dementia in other diseases classified elsewhere without behavioral disturbance: Secondary | ICD-10-CM | POA: Diagnosis not present

## 2020-02-06 DIAGNOSIS — E782 Mixed hyperlipidemia: Secondary | ICD-10-CM | POA: Diagnosis not present

## 2020-02-06 DIAGNOSIS — K2100 Gastro-esophageal reflux disease with esophagitis, without bleeding: Secondary | ICD-10-CM | POA: Diagnosis not present

## 2020-02-11 DIAGNOSIS — E782 Mixed hyperlipidemia: Secondary | ICD-10-CM | POA: Diagnosis not present

## 2020-02-11 DIAGNOSIS — G20 Parkinson's disease: Secondary | ICD-10-CM | POA: Diagnosis not present

## 2020-02-11 DIAGNOSIS — K219 Gastro-esophageal reflux disease without esophagitis: Secondary | ICD-10-CM | POA: Diagnosis not present

## 2020-02-11 DIAGNOSIS — H9313 Tinnitus, bilateral: Secondary | ICD-10-CM | POA: Diagnosis not present

## 2020-02-11 DIAGNOSIS — Z6825 Body mass index (BMI) 25.0-25.9, adult: Secondary | ICD-10-CM | POA: Diagnosis not present

## 2020-02-11 DIAGNOSIS — Z0001 Encounter for general adult medical examination with abnormal findings: Secondary | ICD-10-CM | POA: Diagnosis not present

## 2020-02-21 DIAGNOSIS — G4733 Obstructive sleep apnea (adult) (pediatric): Secondary | ICD-10-CM | POA: Diagnosis not present

## 2020-02-26 DIAGNOSIS — G20 Parkinson's disease: Secondary | ICD-10-CM | POA: Diagnosis not present

## 2020-02-26 DIAGNOSIS — E7849 Other hyperlipidemia: Secondary | ICD-10-CM | POA: Diagnosis not present

## 2020-02-26 DIAGNOSIS — F0280 Dementia in other diseases classified elsewhere without behavioral disturbance: Secondary | ICD-10-CM | POA: Diagnosis not present

## 2020-02-26 DIAGNOSIS — N183 Chronic kidney disease, stage 3 unspecified: Secondary | ICD-10-CM | POA: Diagnosis not present

## 2020-03-22 DIAGNOSIS — G4733 Obstructive sleep apnea (adult) (pediatric): Secondary | ICD-10-CM | POA: Diagnosis not present

## 2020-03-27 DIAGNOSIS — G20 Parkinson's disease: Secondary | ICD-10-CM | POA: Diagnosis not present

## 2020-03-27 DIAGNOSIS — N183 Chronic kidney disease, stage 3 unspecified: Secondary | ICD-10-CM | POA: Diagnosis not present

## 2020-03-27 DIAGNOSIS — E7849 Other hyperlipidemia: Secondary | ICD-10-CM | POA: Diagnosis not present

## 2020-03-27 DIAGNOSIS — F0280 Dementia in other diseases classified elsewhere without behavioral disturbance: Secondary | ICD-10-CM | POA: Diagnosis not present

## 2020-04-22 DIAGNOSIS — G4733 Obstructive sleep apnea (adult) (pediatric): Secondary | ICD-10-CM | POA: Diagnosis not present

## 2020-04-27 DIAGNOSIS — N183 Chronic kidney disease, stage 3 unspecified: Secondary | ICD-10-CM | POA: Diagnosis not present

## 2020-04-27 DIAGNOSIS — F0280 Dementia in other diseases classified elsewhere without behavioral disturbance: Secondary | ICD-10-CM | POA: Diagnosis not present

## 2020-04-27 DIAGNOSIS — E7849 Other hyperlipidemia: Secondary | ICD-10-CM | POA: Diagnosis not present

## 2020-04-27 DIAGNOSIS — G20 Parkinson's disease: Secondary | ICD-10-CM | POA: Diagnosis not present

## 2020-05-22 DIAGNOSIS — G4733 Obstructive sleep apnea (adult) (pediatric): Secondary | ICD-10-CM | POA: Diagnosis not present

## 2020-05-28 DIAGNOSIS — N183 Chronic kidney disease, stage 3 unspecified: Secondary | ICD-10-CM | POA: Diagnosis not present

## 2020-05-28 DIAGNOSIS — G20 Parkinson's disease: Secondary | ICD-10-CM | POA: Diagnosis not present

## 2020-05-28 DIAGNOSIS — E7849 Other hyperlipidemia: Secondary | ICD-10-CM | POA: Diagnosis not present

## 2020-06-08 DIAGNOSIS — E7849 Other hyperlipidemia: Secondary | ICD-10-CM | POA: Diagnosis not present

## 2020-06-08 DIAGNOSIS — E782 Mixed hyperlipidemia: Secondary | ICD-10-CM | POA: Diagnosis not present

## 2020-06-08 DIAGNOSIS — Z1329 Encounter for screening for other suspected endocrine disorder: Secondary | ICD-10-CM | POA: Diagnosis not present

## 2020-06-08 DIAGNOSIS — Z23 Encounter for immunization: Secondary | ICD-10-CM | POA: Diagnosis not present

## 2020-06-08 DIAGNOSIS — K2100 Gastro-esophageal reflux disease with esophagitis, without bleeding: Secondary | ICD-10-CM | POA: Diagnosis not present

## 2020-06-08 DIAGNOSIS — G20 Parkinson's disease: Secondary | ICD-10-CM | POA: Diagnosis not present

## 2020-06-08 DIAGNOSIS — Z1159 Encounter for screening for other viral diseases: Secondary | ICD-10-CM | POA: Diagnosis not present

## 2020-06-08 DIAGNOSIS — N183 Chronic kidney disease, stage 3 unspecified: Secondary | ICD-10-CM | POA: Diagnosis not present

## 2020-06-08 DIAGNOSIS — N400 Enlarged prostate without lower urinary tract symptoms: Secondary | ICD-10-CM | POA: Diagnosis not present

## 2020-06-11 DIAGNOSIS — G20 Parkinson's disease: Secondary | ICD-10-CM | POA: Diagnosis not present

## 2020-06-11 DIAGNOSIS — H9313 Tinnitus, bilateral: Secondary | ICD-10-CM | POA: Diagnosis not present

## 2020-06-11 DIAGNOSIS — M25551 Pain in right hip: Secondary | ICD-10-CM | POA: Diagnosis not present

## 2020-06-11 DIAGNOSIS — Z6828 Body mass index (BMI) 28.0-28.9, adult: Secondary | ICD-10-CM | POA: Diagnosis not present

## 2020-06-11 DIAGNOSIS — Z0001 Encounter for general adult medical examination with abnormal findings: Secondary | ICD-10-CM | POA: Diagnosis not present

## 2020-06-11 DIAGNOSIS — K2100 Gastro-esophageal reflux disease with esophagitis, without bleeding: Secondary | ICD-10-CM | POA: Diagnosis not present

## 2020-06-11 DIAGNOSIS — E7849 Other hyperlipidemia: Secondary | ICD-10-CM | POA: Diagnosis not present

## 2020-06-11 DIAGNOSIS — Z23 Encounter for immunization: Secondary | ICD-10-CM | POA: Diagnosis not present

## 2020-06-21 DIAGNOSIS — M545 Low back pain, unspecified: Secondary | ICD-10-CM | POA: Diagnosis not present

## 2020-06-21 DIAGNOSIS — M1611 Unilateral primary osteoarthritis, right hip: Secondary | ICD-10-CM | POA: Diagnosis not present

## 2020-06-22 DIAGNOSIS — G4733 Obstructive sleep apnea (adult) (pediatric): Secondary | ICD-10-CM | POA: Diagnosis not present

## 2020-06-26 DIAGNOSIS — G20 Parkinson's disease: Secondary | ICD-10-CM | POA: Diagnosis not present

## 2020-06-26 DIAGNOSIS — F0280 Dementia in other diseases classified elsewhere without behavioral disturbance: Secondary | ICD-10-CM | POA: Diagnosis not present

## 2020-06-26 DIAGNOSIS — E7849 Other hyperlipidemia: Secondary | ICD-10-CM | POA: Diagnosis not present

## 2020-06-26 DIAGNOSIS — N183 Chronic kidney disease, stage 3 unspecified: Secondary | ICD-10-CM | POA: Diagnosis not present

## 2020-07-05 DIAGNOSIS — M1611 Unilateral primary osteoarthritis, right hip: Secondary | ICD-10-CM | POA: Diagnosis not present

## 2020-07-12 ENCOUNTER — Encounter (HOSPITAL_COMMUNITY): Payer: Self-pay

## 2020-07-12 NOTE — Progress Notes (Addendum)
COVID Vaccine Completed:  x3 Date COVID Vaccine completed:  Booster 01-22 COVID vaccine manufacturer: Pfizer    [employer]   Date of COVID positive in last 90 days:  PCP - Donzetta Sprung in [place] Cardiologist - Sherian Rein, MD.  Last OV 2017  Chest x-ray - N/A EKG - 07-13-20 in Epic Stress Test - 04-21-15 Care Everywhere ECHO - 04-21-15 Care Everywhere Cardiac Cath -  Pacemaker/ICD device last checked:  Sleep Study - Home sleep test, +sleep apnea CPAP - Yes  Fasting Blood Sugar - N/A Checks Blood Sugar _____ times a day  Blood Thinner Instructions: Aspirin Instructions:  ASA 81 ng  Last Dose: stopped 5 days ago  Activity level:  Can go up a flight of stairs and activities of daily living without stopping and without symptoms       Anesthesia review:  Hx of chest pain and abnormal Echo followed by cardiology.  Parkinson's disease, OSA  Patient denies shortness of breath, fever, cough and chest pain at PAT appointment   Patient verbalized understanding of instructions that were given to them at the PAT appointment. Patient was also instructed that they will need to review over the PAT instructions again at home before surgery.

## 2020-07-12 NOTE — Patient Instructions (Addendum)
DUE TO COVID-19 ONLY ONE VISITOR IS ALLOWED TO COME WITH YOU AND STAY IN THE WAITING ROOM ONLY DURING PRE OP AND PROCEDURE.   IF YOU WILL BE ADMITTED INTO THE HOSPITAL YOU ARE ALLOWED ONE SUPPORT PERSON DURING VISITATION HOURS ONLY (10AM -8PM)   . The support person may change daily. . The support person must pass our screening, gel in and out, and wear a mask at all times, including in the patient's room. . Patients must also wear a mask when staff or their support person are in the room.   COVID SWAB TESTING MUST BE COMPLETED ON:   Friday, 07-16-20 @ 11:10 AM   [address] [place], [place] [address]  (Must self quarantine after testing. Follow instructions on handout.)        Your procedure is scheduled on: Tuesday, 07-20-20   Report to [facility] Main  Entrance    Report to admitting at 8:20 AM   Call this number if you have problems the morning of surgery [phone]   Do not eat food :After Midnight.   May have liquids until 7:45 AM day of surgery  CLEAR LIQUID DIET  Foods Allowed                                                                     Foods Excluded  Water, Black Coffee and tea, regular and decaf           liquids that you cannot  Plain Jell-O in any flavor  (No red)                                  see through such as: Fruit ices (not with fruit pulp)                                      milk, soups, orange juice              Iced Popsicles (No red)                                      All solid food                                   Apple juices Sports drinks like Gatorade (No red) Lightly seasoned clear broth or consume(fat free) Sugar, honey syrup     Complete one Ensure drink the morning of surgery at 7:45 AM the day of surgery.       1. The day of surgery:  ? Drink ONE (1) Pre-Surgery Clear Ensure or G2 by am the morning of surgery. Drink in one sitting. Do not sip.  ? This drink was given to you during your hospital  pre-op  appointment visit. ? Nothing else to drink after completing the  Pre-Surgery Clear Ensure or G2.          If you have questions, please contact your surgeon's office.  Oral Hygiene is also important to reduce your risk of infection.                                    Remember - BRUSH YOUR TEETH THE MORNING OF SURGERY WITH YOUR REGULAR TOOTHPASTE   Do NOT smoke after Midnight   Take these medicines the morning of surgery with A SIP OF WATER:   Atorvastatin, Famotidine, Pantoprazole, Ropinirole, Rytary, Tamsulosin                               You may not have any metal on your body including jewelry, and body piercings             Do not wear  lotions, powders, perfumes/cologne, or deodorant             Men may shave face and neck.   Do not bring valuables to the hospital. [name] IS NOT RESPONSIBLE   FOR VALUABLES.   Contacts, dentures or bridgework may not be worn into surgery.   Bring small overnight bag day of surgery.               Please read over the following fact sheets you were given: IF YOU HAVE QUESTIONS ABOUT YOUR PRE OP INSTRUCTIONS PLEASE CALL  [phone] [facility] - Preparing for Surgery Before surgery, you can play an important role.  Because skin is not sterile, your skin needs to be as free of germs as possible.  You can reduce the number of germs on your skin by washing with CHG (chlorahexidine gluconate) soap before surgery.  CHG is an antiseptic cleaner which kills germs and bonds with the skin to continue killing germs even after washing. Please DO NOT use if you have an allergy to CHG or antibacterial soaps.  If your skin becomes reddened/irritated stop using the CHG and inform your nurse when you arrive at Short Stay. Do not shave (including legs and underarms) for at least 48 hours prior to the first CHG shower.  You may shave your face/neck.  Please follow these instructions carefully:  1.  Shower with CHG Soap the night before surgery and  the  morning of surgery.  2.  If you choose to wash your hair, wash your hair first as usual with your normal  shampoo.  3.  After you shampoo, rinse your hair and body thoroughly to remove the shampoo.                             4.  Use CHG as you would any other liquid soap.  You can apply chg directly to the skin and wash.  Gently with a scrungie or clean washcloth.  5.  Apply the CHG Soap to your body ONLY FROM THE NECK DOWN.   Do   not use on face/ open                           Wound or open sores. Avoid contact with eyes, ears mouth and   genitals (private parts).                       Wash face,  Genitals (private parts) with your normal soap.  6.  Wash thoroughly, paying special attention to the area where your    surgery  will be performed.  7.  Thoroughly rinse your body with warm water from the neck down.  8.  DO NOT shower/wash with your normal soap after using and rinsing off the CHG Soap.                9.  Pat yourself dry with a clean towel.            10.  Wear clean pajamas.            11.  Place clean sheets on your bed the night of your first shower and do not  sleep with pets. Day of Surgery : Do not apply any lotions/deodorants the morning of surgery.  Please wear clean clothes to the hospital/surgery center.  FAILURE TO FOLLOW THESE INSTRUCTIONS MAY RESULT IN THE CANCELLATION OF YOUR SURGERY  PATIENT SIGNATURE_________________________________  NURSE SIGNATURE__________________________________  ________________________________________________________________________   Larry Miller  An incentive spirometer is a tool that can help keep your lungs clear and active. This tool measures how well you are filling your lungs with each breath. Taking long deep breaths may help reverse or decrease the chance of developing breathing (pulmonary) problems (especially infection) following:  A long period of time when you are unable to move or be  active. BEFORE THE PROCEDURE   If the spirometer includes an indicator to show your best effort, your nurse or respiratory therapist will set it to a desired goal.  If possible, sit up straight or lean slightly forward. Try not to slouch.  Hold the incentive spirometer in an upright position. INSTRUCTIONS FOR USE  1. Sit on the edge of your bed if possible, or sit up as far as you can in bed or on a chair. 2. Hold the incentive spirometer in an upright position. 3. Breathe out normally. 4. Place the mouthpiece in your mouth and seal your lips tightly around it. 5. Breathe in slowly and as deeply as possible, raising the piston or the ball toward the top of the column. 6. Hold your breath for 3-5 seconds or for as long as possible. Allow the piston or ball to fall to the bottom of the column. 7. Remove the mouthpiece from your mouth and breathe out normally. 8. Rest for a few seconds and repeat Steps 1 through 7 at least 10 times every 1-2 hours when you are awake. Take your time and take a few normal breaths between deep breaths. 9. The spirometer may include an indicator to show your best effort. Use the indicator as a goal to work toward during each repetition. 10. After each set of 10 deep breaths, practice coughing to be sure your lungs are clear. If you have an incision (the cut made at the time of surgery), support your incision when coughing by placing a pillow or rolled up towels firmly against it. Once you are able to get out of bed, walk around indoors and cough well. You may stop using the incentive spirometer when instructed by your caregiver.  RISKS AND COMPLICATIONS  Take your time so you do not get dizzy or light-headed.  If you are in pain, you may need to take or ask for pain medication before doing incentive spirometry. It is harder to take a deep breath if you are having pain. AFTER USE  Rest and breathe slowly and easily.  It can be helpful to keep track of a  log of  your progress. Your caregiver can provide you with a simple table to help with this. If you are using the spirometer at home, follow these instructions: SEEK MEDICAL CARE IF:   You are having difficultly using the spirometer.  You have trouble using the spirometer as often as instructed.  Your pain medication is not giving enough relief while using the spirometer.  You develop fever of 100.5 F (38.1 C) or higher. SEEK IMMEDIATE MEDICAL CARE IF:   You cough up bloody sputum that had not been present before.  You develop fever of 102 F (38.9 C) or greater.  You develop worsening pain at or near the incision site. MAKE SURE YOU:   Understand these instructions.  Will watch your condition.  Will get help right away if you are not doing well or get worse. Document Released: 09/25/2006 Document Revised: 08/07/2011 Document Reviewed: 11/26/2006 [facility] Patient Information 2014 [employer], [place].   ________________________________________________________________________

## 2020-07-13 ENCOUNTER — Encounter (HOSPITAL_COMMUNITY)
Admission: RE | Admit: 2020-07-13 | Discharge: 2020-07-13 | Disposition: A | Discharge status: Home / Self Care | Payer: Medicare PPO | Source: Ambulatory Visit | Attending: Orthopedic Surgery | Admitting: Orthopedic Surgery

## 2020-07-13 ENCOUNTER — Encounter (HOSPITAL_COMMUNITY): Payer: Self-pay

## 2020-07-13 ENCOUNTER — Other Ambulatory Visit: Payer: Self-pay

## 2020-07-13 DIAGNOSIS — Z01818 Encounter for other preprocedural examination: Secondary | ICD-10-CM | POA: Insufficient documentation

## 2020-07-13 HISTORY — DX: Pure hypercholesterolemia, unspecified: E78.00

## 2020-07-13 HISTORY — DX: Unspecified osteoarthritis, unspecified site: M19.90

## 2020-07-13 HISTORY — DX: Sleep apnea, unspecified: G47.30

## 2020-07-13 HISTORY — DX: Parkinson's disease: G20

## 2020-07-13 HISTORY — DX: Personal history of urinary calculi: Z87.442

## 2020-07-13 HISTORY — DX: Gastro-esophageal reflux disease without esophagitis: K21.9

## 2020-07-13 LAB — CBC
HCT: 41.1 % (ref 39.0–52.0)
Hemoglobin: 13.9 g/dL (ref 13.0–17.0)
MCH: 31.8 pg (ref 26.0–34.0)
MCHC: 33.8 g/dL (ref 30.0–36.0)
MCV: 94.1 fL (ref 80.0–100.0)
Platelets: 244 10*3/uL (ref 150–400)
RBC: 4.37 MIL/uL (ref 4.22–5.81)
RDW: 13.5 % (ref 11.5–15.5)
WBC: 6.5 10*3/uL (ref 4.0–10.5)
nRBC: 0 % (ref 0.0–0.2)

## 2020-07-13 LAB — SURGICAL PCR SCREEN
MRSA, PCR: NEGATIVE
Staphylococcus aureus: NEGATIVE

## 2020-07-13 LAB — BASIC METABOLIC PANEL
Anion gap: 12 (ref 5–15)
BUN: 15 mg/dL (ref 8–23)
CO2: 22 mmol/L (ref 22–32)
Calcium: 9.9 mg/dL (ref 8.9–10.3)
Chloride: 108 mmol/L (ref 98–111)
Creatinine, Ser: 0.74 mg/dL (ref 0.61–1.24)
GFR, Estimated: >60 mL/min (ref >60)
Glucose, Bld: 93 mg/dL (ref 70–99)
Potassium: 4 mmol/L (ref 3.5–5.1)
Sodium: 142 mmol/L (ref 135–145)

## 2020-07-13 NOTE — Care Plan (Signed)
Ortho Bundle Case Management Note  Patient Details  Name: Larry Miller MRN: [id number] Date of Birth: 07-22-1950  Spoke with patient prior to surgery. He will discharge to home with wife. Rolling walker and 3n1 ordered for home use. OPPT set up with [facility]. Patient and MD in agreement with plan. Choice offered.                     DME Arranged:  Dan Humphreys rolling,Bedside commode DME Agency:  [employer]  HH Arranged:    HH Agency:     Additional Comments: Please contact me with any questions of if this plan should need to change.  Shauna Hugh,  RN,BSN,MHA,CCM  [facility] Orthopaedic Specialist  [phone] 07/13/2020, 9:08 AM

## 2020-07-14 ENCOUNTER — Encounter (HOSPITAL_COMMUNITY): Payer: Self-pay

## 2020-07-14 NOTE — Progress Notes (Signed)
Anesthesia Chart Review   Case: [id number] Date/Time: 07/20/20 1036   Procedure: TOTAL HIP ARTHROPLASTY (Right Hip)   Anesthesia type: Choice   Pre-op diagnosis: djd right hip   Location: [facility] ROOM 07 / [facility] ORS   Surgeons: Teryl Lucy, MD      DISCUSSION:69 y.o. former smoker with h/o GERD, sleep apnea, parkinson disease, right hip djd scheduled for above procedure 07/20/20 with Dr. Teryl Lucy.   Pt evaluated by cardiology in 2016 for chest pain. Per OV note, "his initial clinic visit with [place] on April 21, 2015. He initially presented to our clinic secondary to an abnormal electrocardiogram revealing left ventricular hypertrophy. A subsequent echocardiogram revealed mild left ventricular hypertrophy. At his initial clinic visit, he also mentioned some exertional chest pain with both typical and atypical components. A subsequent stress echocardiogram revealed no evidence of ischemia by wall motion analysis."  See for follow up 08/04/2015.  At this time recommended he follow up on as needed basis.   Anticipate pt can proceed with planned procedure barring acute status change.   VS: BP (!) 152/80    Pulse 63    Temp 36.7 C (Oral)    Resp 16    Ht 6\' 1"  (1.854 m)    Wt 92.9 kg    SpO2 98%    BMI 27.02 kg/m   PROVIDERS: , MD is PCP   Richardean Chimera, MD is Cardiologist  LABS: Labs reviewed: Acceptable for surgery. (all labs ordered are listed, but only abnormal results are displayed)  Labs Reviewed  SURGICAL PCR SCREEN  BASIC METABOLIC PANEL  CBC     IMAGES:   EKG: 07/13/2018 Rate 60 bpm  Normal sinus rhythm Artifact Normal ECG No old tracing to compare   CV: Echo 04/21/2015 INTERPRETATION ---------------------------------------------------------------   ABNORMAL STRESS TEST. POSITIVE ECG WITH NO WALL MOTION ABNORMALITIES AT  REST AND PEAK EXERCISE.  NORMAL LA PRESSURES WITH NORMAL DIASTOLIC FUNCTION  VALVULAR REGURGITATION: TRIVIAL MR, TRIVIAL PR,  TRIVIAL TR  NO VALVULAR STENOSIS  Note: POSITIVE EKG WITH DEVELOPMENT OF CHEST PAIN, NO OBVIOUS WALL MOTION  ABNORMALITIES BUT POOR IMAGE QUALITY.   Past Medical History:  Diagnosis Date   Arthritis    GERD (gastroesophageal reflux disease)    History of kidney stones    Hypercholesterolemia    Paralysis agitans (HCC)    Parkinson disease (HCC)    Sleep apnea     Past Surgical History:  Procedure Laterality Date   ROTATOR CUFF REPAIR Left 2020    MEDICATIONS:  aspirin EC 81 MG tablet   atorvastatin (LIPITOR) 10 MG tablet   Cholecalciferol (VITAMIN D3) 50 MCG (2000 UT) TABS   famotidine (PEPCID) 40 MG tablet   Menaquinone-7 (VITAMIN K2 PO)   Multiple Vitamins-Minerals (HAIR/SKIN/NAILS) CAPS   Multiple Vitamins-Minerals (MULTIVITAMIN WITH MINERALS) tablet   pantoprazole (PROTONIX) 40 MG tablet   ropinirole (REQUIP) 5 MG tablet   RYTARY 48.75-195 MG CPCR   tamsulosin (FLOMAX) 0.4 MG CAPS capsule   No current facility-administered medications for this encounter.     2021, PA-C [facility] Pre-Surgical Testing [phone]

## 2020-07-16 ENCOUNTER — Other Ambulatory Visit (HOSPITAL_COMMUNITY)
Admission: RE | Admit: 2020-07-16 | Discharge: 2020-07-16 | Disposition: A | Discharge status: Home / Self Care | Payer: Medicare PPO | Source: Ambulatory Visit | Attending: Orthopedic Surgery | Admitting: Orthopedic Surgery

## 2020-07-16 DIAGNOSIS — Z01812 Encounter for preprocedural laboratory examination: Secondary | ICD-10-CM | POA: Diagnosis not present

## 2020-07-16 DIAGNOSIS — Z20822 Contact with and (suspected) exposure to covid-19: Secondary | ICD-10-CM | POA: Insufficient documentation

## 2020-07-17 LAB — SARS CORONAVIRUS 2 (TAT 6-24 HRS): SARS Coronavirus 2: NEGATIVE

## 2020-07-19 NOTE — Anesthesia Preprocedure Evaluation (Addendum)
Anesthesia Evaluation  Patient identified by MRN, date of birth, ID band Patient awake    Reviewed: Allergy & Precautions, NPO status , Patient's Chart, lab work & pertinent test results  History of Anesthesia Complications Negative for: history of anesthetic complications  Airway Mallampati: II  TM Distance: >3 FB Neck ROM: Full    Dental no notable dental hx. (+) Dental Advisory Given   Pulmonary sleep apnea , former smoker,    Pulmonary exam normal        Cardiovascular negative cardio ROS Normal cardiovascular exam     Neuro/Psych Parkinson's dz negative neurological ROS     GI/Hepatic Neg liver ROS, GERD  Medicated,  Endo/Other  negative endocrine ROS  Renal/GU negative Renal ROS     Musculoskeletal  (+) Arthritis ,   Abdominal   Peds  Hematology negative hematology ROS (+)   Anesthesia Other Findings   Reproductive/Obstetrics                            Anesthesia Physical Anesthesia Plan  ASA: III  Anesthesia Plan: Spinal   Post-op Pain Management:    Induction:   PONV Risk Score and Plan: 2 and Ondansetron and Propofol infusion  Airway Management Planned: Natural Airway  Additional Equipment:   Intra-op Plan:   Post-operative Plan:   Informed Consent: I have reviewed the patients History and Physical, chart, labs and discussed the procedure including the risks, benefits and alternatives for the proposed anesthesia with the patient or authorized representative who has indicated his/her understanding and acceptance.     Dental advisory given  Plan Discussed with: Anesthesiologist and CRNA  Anesthesia Plan Comments:        Anesthesia Quick Evaluation

## 2020-07-20 ENCOUNTER — Other Ambulatory Visit: Payer: Self-pay

## 2020-07-20 ENCOUNTER — Ambulatory Visit (HOSPITAL_COMMUNITY): Payer: Medicare PPO | Admitting: Physician Assistant

## 2020-07-20 ENCOUNTER — Ambulatory Visit (HOSPITAL_COMMUNITY): Payer: Medicare PPO | Admitting: Anesthesiology

## 2020-07-20 ENCOUNTER — Observation Stay (HOSPITAL_COMMUNITY): Payer: Medicare PPO

## 2020-07-20 ENCOUNTER — Encounter (HOSPITAL_COMMUNITY): Payer: Self-pay | Admitting: Orthopedic Surgery

## 2020-07-20 ENCOUNTER — Observation Stay (HOSPITAL_COMMUNITY)
Admission: RE | Admit: 2020-07-20 | Discharge: 2020-07-21 | Disposition: A | Discharge status: Home / Self Care | Payer: Medicare PPO | Attending: Orthopedic Surgery | Admitting: Orthopedic Surgery

## 2020-07-20 ENCOUNTER — Encounter (HOSPITAL_COMMUNITY)
Admission: RE | Disposition: A | Discharge status: Home / Self Care | Payer: Self-pay | Source: Home / Self Care | Attending: Orthopedic Surgery

## 2020-07-20 DIAGNOSIS — Z96641 Presence of right artificial hip joint: Secondary | ICD-10-CM

## 2020-07-20 DIAGNOSIS — Z7982 Long term (current) use of aspirin: Secondary | ICD-10-CM | POA: Insufficient documentation

## 2020-07-20 DIAGNOSIS — M1611 Unilateral primary osteoarthritis, right hip: Secondary | ICD-10-CM | POA: Diagnosis not present

## 2020-07-20 DIAGNOSIS — K219 Gastro-esophageal reflux disease without esophagitis: Secondary | ICD-10-CM | POA: Diagnosis not present

## 2020-07-20 DIAGNOSIS — Z79899 Other long term (current) drug therapy: Secondary | ICD-10-CM | POA: Diagnosis not present

## 2020-07-20 DIAGNOSIS — G20 Parkinson's disease: Secondary | ICD-10-CM | POA: Insufficient documentation

## 2020-07-20 DIAGNOSIS — M25551 Pain in right hip: Secondary | ICD-10-CM | POA: Diagnosis present

## 2020-07-20 DIAGNOSIS — Z87891 Personal history of nicotine dependence: Secondary | ICD-10-CM | POA: Insufficient documentation

## 2020-07-20 DIAGNOSIS — E7800 Pure hypercholesterolemia, unspecified: Secondary | ICD-10-CM | POA: Diagnosis not present

## 2020-07-20 DIAGNOSIS — Z471 Aftercare following joint replacement surgery: Secondary | ICD-10-CM | POA: Diagnosis not present

## 2020-07-20 HISTORY — PX: TOTAL HIP ARTHROPLASTY: SHX124

## 2020-07-20 SURGERY — ARTHROPLASTY, HIP, TOTAL,POSTERIOR APPROACH
Anesthesia: Spinal | Site: Hip | Laterality: Right

## 2020-07-20 MED ORDER — POLYETHYLENE GLYCOL 3350 17 G PO PACK: 17.0000 g | PACK | Freq: Every day | ORAL | Status: DC | PRN

## 2020-07-20 MED ORDER — CHLORHEXIDINE GLUCONATE 0.12 % MT SOLN
15.0000 mL | Freq: Once | OROMUCOSAL | Status: AC
  Administered 2020-07-20: 15 mL via OROMUCOSAL

## 2020-07-20 MED ORDER — ALBUMIN HUMAN 5 % IV SOLN: INTRAVENOUS | Status: DC | PRN

## 2020-07-20 MED ORDER — PANTOPRAZOLE SODIUM 40 MG PO TBEC
40.0000 mg | DELAYED_RELEASE_TABLET | Freq: Every day | ORAL | Status: DC
  Administered 2020-07-20 – 2020-07-21 (×2): 40 mg via ORAL
  Filled 2020-07-20 (×2): qty 1

## 2020-07-20 MED ORDER — AMISULPRIDE (ANTIEMETIC) 5 MG/2ML IV SOLN: 10.0000 mg | Freq: Once | INTRAVENOUS | Status: DC | PRN

## 2020-07-20 MED ORDER — ORAL CARE MOUTH RINSE: 15.0000 mL | Freq: Once | OROMUCOSAL | Status: AC

## 2020-07-20 MED ORDER — PHENYLEPHRINE HCL-NACL 10-0.9 MG/250ML-% IV SOLN
INTRAVENOUS | Status: DC | PRN
  Administered 2020-07-20: 20 ug/min via INTRAVENOUS

## 2020-07-20 MED ORDER — GLYCOPYRROLATE PF 0.2 MG/ML IJ SOSY
PREFILLED_SYRINGE | INTRAMUSCULAR | Status: AC
  Filled 2020-07-20: qty 1

## 2020-07-20 MED ORDER — TRANEXAMIC ACID-NACL 1000-0.7 MG/100ML-% IV SOLN
1000.0000 mg | Freq: Once | INTRAVENOUS | Status: AC
  Administered 2020-07-20: 1000 mg via INTRAVENOUS
  Filled 2020-07-20: qty 100

## 2020-07-20 MED ORDER — HYDROCODONE-ACETAMINOPHEN 5-325 MG PO TABS
1.0000 | ORAL_TABLET | ORAL | Status: DC | PRN
  Administered 2020-07-20 – 2020-07-21 (×4): 2 via ORAL
  Filled 2020-07-20 (×4): qty 2

## 2020-07-20 MED ORDER — PROPOFOL 10 MG/ML IV BOLUS
INTRAVENOUS | Status: DC | PRN
  Administered 2020-07-20 (×2): 20 mg via INTRAVENOUS

## 2020-07-20 MED ORDER — ONDANSETRON HCL 4 MG/2ML IJ SOLN
INTRAMUSCULAR | Status: AC
  Filled 2020-07-20: qty 2

## 2020-07-20 MED ORDER — EPHEDRINE SULFATE-NACL 50-0.9 MG/10ML-% IV SOSY
PREFILLED_SYRINGE | INTRAVENOUS | Status: DC | PRN
  Administered 2020-07-20: 5 mg via INTRAVENOUS

## 2020-07-20 MED ORDER — ROPINIROLE HCL 1 MG PO TABS: 5.0000 mg | ORAL_TABLET | Freq: Three times a day (TID) | ORAL | Status: DC

## 2020-07-20 MED ORDER — BUPIVACAINE HCL (PF) 0.25 % IJ SOLN
INTRAMUSCULAR | Status: DC | PRN
  Administered 2020-07-20: 30 mL

## 2020-07-20 MED ORDER — METOCLOPRAMIDE HCL 5 MG/ML IJ SOLN: 5.0000 mg | Freq: Three times a day (TID) | INTRAMUSCULAR | Status: DC | PRN

## 2020-07-20 MED ORDER — FENTANYL CITRATE (PF) 100 MCG/2ML IJ SOLN
INTRAMUSCULAR | Status: AC
  Filled 2020-07-20: qty 2

## 2020-07-20 MED ORDER — METHOCARBAMOL 500 MG PO TABS
500.0000 mg | ORAL_TABLET | Freq: Four times a day (QID) | ORAL | Status: DC | PRN
  Administered 2020-07-20 – 2020-07-21 (×3): 500 mg via ORAL
  Filled 2020-07-20 (×3): qty 1

## 2020-07-20 MED ORDER — MIDAZOLAM HCL 5 MG/5ML IJ SOLN
INTRAMUSCULAR | Status: DC | PRN
  Administered 2020-07-20: 2 mg via INTRAVENOUS

## 2020-07-20 MED ORDER — MORPHINE SULFATE (PF) 4 MG/ML IV SOLN: 0.5000 mg | INTRAVENOUS | Status: DC | PRN

## 2020-07-20 MED ORDER — PROPOFOL 1000 MG/100ML IV EMUL
INTRAVENOUS | Status: AC
  Filled 2020-07-20: qty 100

## 2020-07-20 MED ORDER — ATORVASTATIN CALCIUM 10 MG PO TABS
10.0000 mg | ORAL_TABLET | ORAL | Status: DC
  Administered 2020-07-21: 10 mg via ORAL
  Filled 2020-07-20: qty 1

## 2020-07-20 MED ORDER — ACETAMINOPHEN 500 MG PO TABS
1000.0000 mg | ORAL_TABLET | Freq: Once | ORAL | Status: AC
  Administered 2020-07-20: 1000 mg via ORAL

## 2020-07-20 MED ORDER — METOCLOPRAMIDE HCL 5 MG PO TABS: 5.0000 mg | ORAL_TABLET | Freq: Three times a day (TID) | ORAL | Status: DC | PRN

## 2020-07-20 MED ORDER — DOCUSATE SODIUM 100 MG PO CAPS
100.0000 mg | ORAL_CAPSULE | Freq: Two times a day (BID) | ORAL | Status: DC
  Administered 2020-07-20 – 2020-07-21 (×2): 100 mg via ORAL
  Filled 2020-07-20 (×2): qty 1

## 2020-07-20 MED ORDER — POTASSIUM CHLORIDE IN NACL 20-0.45 MEQ/L-% IV SOLN
INTRAVENOUS | Status: DC
  Filled 2020-07-20 (×2): qty 1000

## 2020-07-20 MED ORDER — MENTHOL 3 MG MT LOZG: 1.0000 | LOZENGE | OROMUCOSAL | Status: DC | PRN

## 2020-07-20 MED ORDER — FAMOTIDINE 20 MG PO TABS
40.0000 mg | ORAL_TABLET | Freq: Two times a day (BID) | ORAL | Status: DC
  Administered 2020-07-20 – 2020-07-21 (×2): 40 mg via ORAL
  Filled 2020-07-20 (×2): qty 2

## 2020-07-20 MED ORDER — ACETAMINOPHEN 325 MG PO TABS: 325.0000 mg | ORAL_TABLET | Freq: Four times a day (QID) | ORAL | Status: DC | PRN

## 2020-07-20 MED ORDER — BISACODYL 10 MG RE SUPP: 10.0000 mg | Freq: Every day | RECTAL | Status: DC | PRN

## 2020-07-20 MED ORDER — CARBIDOPA-LEVODOPA ER 48.75-195 MG PO CPCR
2.0000 | ORAL_CAPSULE | Freq: Three times a day (TID) | ORAL | Status: DC
  Administered 2020-07-20 – 2020-07-21 (×3): 2 via ORAL

## 2020-07-20 MED ORDER — BUPIVACAINE HCL (PF) 0.25 % IJ SOLN
INTRAMUSCULAR | Status: AC
  Filled 2020-07-20: qty 30

## 2020-07-20 MED ORDER — FENTANYL CITRATE (PF) 100 MCG/2ML IJ SOLN
INTRAMUSCULAR | Status: DC | PRN
  Administered 2020-07-20: 100 ug via INTRAVENOUS

## 2020-07-20 MED ORDER — CEFAZOLIN SODIUM-DEXTROSE 2-4 GM/100ML-% IV SOLN
2.0000 g | INTRAVENOUS | Status: AC
  Administered 2020-07-20: 2 g via INTRAVENOUS
  Filled 2020-07-20: qty 100

## 2020-07-20 MED ORDER — ALUM & MAG HYDROXIDE-SIMETH 200-200-20 MG/5ML PO SUSP: 30.0000 mL | ORAL | Status: DC | PRN

## 2020-07-20 MED ORDER — ASPIRIN EC 325 MG PO TBEC
325.0000 mg | DELAYED_RELEASE_TABLET | Freq: Every day | ORAL | Status: DC
  Administered 2020-07-21: 325 mg via ORAL
  Filled 2020-07-20: qty 1

## 2020-07-20 MED ORDER — PROPOFOL 500 MG/50ML IV EMUL
INTRAVENOUS | Status: AC
  Filled 2020-07-20: qty 50

## 2020-07-20 MED ORDER — PHENOL 1.4 % MT LIQD: 1.0000 | OROMUCOSAL | Status: DC | PRN

## 2020-07-20 MED ORDER — EPHEDRINE 5 MG/ML INJ
INTRAVENOUS | Status: AC
  Filled 2020-07-20: qty 10

## 2020-07-20 MED ORDER — ONDANSETRON HCL 4 MG PO TABS: 4.0000 mg | ORAL_TABLET | Freq: Four times a day (QID) | ORAL | Status: DC | PRN

## 2020-07-20 MED ORDER — TRANEXAMIC ACID-NACL 1000-0.7 MG/100ML-% IV SOLN
1000.0000 mg | INTRAVENOUS | Status: AC
  Administered 2020-07-20: 1000 mg via INTRAVENOUS
  Filled 2020-07-20: qty 100

## 2020-07-20 MED ORDER — VITAMIN D 25 MCG (1000 UNIT) PO TABS
2000.0000 [IU] | ORAL_TABLET | Freq: Every day | ORAL | Status: DC
  Administered 2020-07-21: 2000 [IU] via ORAL
  Filled 2020-07-20: qty 2

## 2020-07-20 MED ORDER — PROPOFOL 500 MG/50ML IV EMUL
INTRAVENOUS | Status: DC | PRN
  Administered 2020-07-20: 100 ug/kg/min via INTRAVENOUS

## 2020-07-20 MED ORDER — ACETAMINOPHEN 500 MG PO TABS
1000.0000 mg | ORAL_TABLET | Freq: Once | ORAL | Status: DC
  Filled 2020-07-20: qty 2

## 2020-07-20 MED ORDER — KETOROLAC TROMETHAMINE 30 MG/ML IJ SOLN
INTRAMUSCULAR | Status: AC
  Filled 2020-07-20: qty 1

## 2020-07-20 MED ORDER — POVIDONE-IODINE 10 % EX SWAB: 2.0000 "application " | Freq: Once | CUTANEOUS | Status: DC

## 2020-07-20 MED ORDER — GLYCOPYRROLATE 0.2 MG/ML IJ SOLN
INTRAMUSCULAR | Status: DC | PRN
  Administered 2020-07-20: .1 mg via INTRAVENOUS

## 2020-07-20 MED ORDER — MAGNESIUM CITRATE PO SOLN: 1.0000 | Freq: Once | ORAL | Status: DC | PRN

## 2020-07-20 MED ORDER — DEXAMETHASONE SODIUM PHOSPHATE 10 MG/ML IJ SOLN
INTRAMUSCULAR | Status: AC
  Filled 2020-07-20: qty 1

## 2020-07-20 MED ORDER — FENTANYL CITRATE (PF) 100 MCG/2ML IJ SOLN: 25.0000 ug | INTRAMUSCULAR | Status: DC | PRN

## 2020-07-20 MED ORDER — VITAMIN D3 50 MCG (2000 UT) PO TABS: 2000.0000 [IU] | ORAL_TABLET | Freq: Every day | ORAL | Status: DC

## 2020-07-20 MED ORDER — CELECOXIB 200 MG PO CAPS
200.0000 mg | ORAL_CAPSULE | Freq: Once | ORAL | Status: AC
  Administered 2020-07-20: 200 mg via ORAL
  Filled 2020-07-20: qty 1

## 2020-07-20 MED ORDER — PROPOFOL 10 MG/ML IV BOLUS
INTRAVENOUS | Status: AC
  Filled 2020-07-20: qty 20

## 2020-07-20 MED ORDER — TAMSULOSIN HCL 0.4 MG PO CAPS: 0.4000 mg | ORAL_CAPSULE | Freq: Every day | ORAL | Status: DC

## 2020-07-20 MED ORDER — DEXAMETHASONE SODIUM PHOSPHATE 10 MG/ML IJ SOLN
INTRAMUSCULAR | Status: DC | PRN
  Administered 2020-07-20: 10 mg via INTRAVENOUS

## 2020-07-20 MED ORDER — 0.9 % SODIUM CHLORIDE (POUR BTL) OPTIME
TOPICAL | Status: DC | PRN
  Administered 2020-07-20: 1000 mL

## 2020-07-20 MED ORDER — TRANEXAMIC ACID-NACL 1000-0.7 MG/100ML-% IV SOLN: 1000.0000 mg | Freq: Once | INTRAVENOUS | Status: DC

## 2020-07-20 MED ORDER — KETOROLAC TROMETHAMINE 30 MG/ML IJ SOLN
INTRAMUSCULAR | Status: DC | PRN
  Administered 2020-07-20: 30 mg

## 2020-07-20 MED ORDER — POVIDONE-IODINE 7.5 % EX SOLN: Freq: Once | CUTANEOUS | Status: AC

## 2020-07-20 MED ORDER — HYDROCODONE-ACETAMINOPHEN 7.5-325 MG PO TABS: 1.0000 | ORAL_TABLET | ORAL | Status: DC | PRN

## 2020-07-20 MED ORDER — ONDANSETRON HCL 4 MG/2ML IJ SOLN: 4.0000 mg | Freq: Four times a day (QID) | INTRAMUSCULAR | Status: DC | PRN

## 2020-07-20 MED ORDER — CEFAZOLIN SODIUM-DEXTROSE 2-4 GM/100ML-% IV SOLN
2.0000 g | Freq: Four times a day (QID) | INTRAVENOUS | Status: AC
Start: 2020-07-20 — End: 2020-07-20
  Administered 2020-07-20 (×2): 2 g via INTRAVENOUS
  Filled 2020-07-20 (×2): qty 100

## 2020-07-20 MED ORDER — METHOCARBAMOL 500 MG IVPB - SIMPLE MED
500.0000 mg | Freq: Four times a day (QID) | INTRAVENOUS | Status: DC | PRN
  Filled 2020-07-20: qty 50

## 2020-07-20 MED ORDER — ACETAMINOPHEN 500 MG PO TABS
500.0000 mg | ORAL_TABLET | Freq: Four times a day (QID) | ORAL | Status: AC
  Administered 2020-07-20 – 2020-07-21 (×4): 500 mg via ORAL
  Filled 2020-07-20 (×4): qty 1

## 2020-07-20 MED ORDER — STERILE WATER FOR IRRIGATION IR SOLN
Status: DC | PRN
  Administered 2020-07-20: 2000 mL

## 2020-07-20 MED ORDER — ROPINIROLE HCL 1 MG PO TABS
5.0000 mg | ORAL_TABLET | Freq: Three times a day (TID) | ORAL | Status: DC
  Administered 2020-07-20 – 2020-07-21 (×3): 5 mg via ORAL
  Filled 2020-07-20 (×3): qty 5

## 2020-07-20 MED ORDER — LACTATED RINGERS IV SOLN: INTRAVENOUS | Status: DC

## 2020-07-20 MED ORDER — DEXAMETHASONE SODIUM PHOSPHATE 10 MG/ML IJ SOLN
10.0000 mg | Freq: Once | INTRAMUSCULAR | Status: AC
  Administered 2020-07-21: 10 mg via INTRAVENOUS
  Filled 2020-07-20: qty 1

## 2020-07-20 MED ORDER — ALBUMIN HUMAN 5 % IV SOLN
INTRAVENOUS | Status: AC
  Filled 2020-07-20: qty 500

## 2020-07-20 MED ORDER — TAMSULOSIN HCL 0.4 MG PO CAPS
0.4000 mg | ORAL_CAPSULE | Freq: Every day | ORAL | Status: DC
Start: 2020-07-20 — End: 2020-07-21
  Administered 2020-07-20 – 2020-07-21 (×2): 0.4 mg via ORAL
  Filled 2020-07-20 (×2): qty 1

## 2020-07-20 MED ORDER — DIPHENHYDRAMINE HCL 12.5 MG/5ML PO ELIX: 12.5000 mg | ORAL_SOLUTION | ORAL | Status: DC | PRN

## 2020-07-20 MED ORDER — MIDAZOLAM HCL 2 MG/2ML IJ SOLN
INTRAMUSCULAR | Status: AC
  Filled 2020-07-20: qty 2

## 2020-07-20 SURGICAL SUPPLY — 59 items
BIT DRILL 2.0X128 (BIT) ×2 IMPLANT
BLADE SAW SAG 73X25 THK (BLADE) ×1
BLADE SAW SGTL 73X25 THK (BLADE) ×1 IMPLANT
CLSR STERI-STRIP ANTIMIC 1/2X4 (GAUZE/BANDAGES/DRESSINGS) ×2 IMPLANT
COVER SURGICAL LIGHT HANDLE (MISCELLANEOUS) ×2 IMPLANT
COVER WAND RF STERILE (DRAPES) IMPLANT
CUP ACET PNNCL SECTR W/GRIP 56 (Hips) ×1 IMPLANT
DRAPE INCISE IOBAN 66X45 STRL (DRAPES) ×2 IMPLANT
DRAPE ORTHO SPLIT 77X108 STRL (DRAPES) ×2
DRAPE POUCH INSTRU U-SHP 10X18 (DRAPES) ×2 IMPLANT
DRAPE SHEET LG 3/4 BI-LAMINATE (DRAPES) ×2 IMPLANT
DRAPE SURG 17X11 SM STRL (DRAPES) ×2 IMPLANT
DRAPE SURG ORHT 6 SPLT 77X108 (DRAPES) ×2 IMPLANT
DRAPE U-SHAPE 47X51 STRL (DRAPES) ×2 IMPLANT
DRSG MEPILEX BORDER 4X12 (GAUZE/BANDAGES/DRESSINGS) ×2 IMPLANT
DRSG MEPILEX BORDER 4X8 (GAUZE/BANDAGES/DRESSINGS) IMPLANT
DURAPREP 26ML APPLICATOR (WOUND CARE) ×4 IMPLANT
ELECT BLADE TIP CTD 4 INCH (ELECTRODE) ×2 IMPLANT
ELECT REM PT RETURN 15FT ADLT (MISCELLANEOUS) ×2 IMPLANT
ELIMINATOR HOLE APEX DEPUY (Hips) ×2 IMPLANT
FACESHIELD WRAPAROUND (MASK) ×2 IMPLANT
GLOVE SRG 8 PF TXTR STRL LF DI (GLOVE) ×1 IMPLANT
GLOVE SURG ENC MOIS LTX SZ7 (GLOVE) ×2 IMPLANT
GLOVE SURG ENC MOIS LTX SZ7.5 (GLOVE) ×2 IMPLANT
GLOVE SURG UNDER LTX SZ6.5 (GLOVE) ×2 IMPLANT
GLOVE SURG UNDER POLY LF SZ8 (GLOVE) ×1
GOWN STRL REUS W/TWL LRG LVL3 (GOWN DISPOSABLE) ×4 IMPLANT
HEAD M SROM 36MM PLUS 1.5 (Hips) ×1 IMPLANT
HOOD PEEL AWAY FLYTE STAYCOOL (MISCELLANEOUS) ×6 IMPLANT
KIT BASIN OR (CUSTOM PROCEDURE TRAY) ×2 IMPLANT
KIT TURNOVER KIT A (KITS) ×2 IMPLANT
MANIFOLD NEPTUNE II (INSTRUMENTS) ×2 IMPLANT
NDL SAFETY ECLIPSE 18X1.5 (NEEDLE) ×2 IMPLANT
NEEDLE HYPO 18GX1.5 SHARP (NEEDLE) ×2
NEEDLE MA TROC 1/2 (NEEDLE) IMPLANT
NS IRRIG 1000ML POUR BTL (IV SOLUTION) ×2 IMPLANT
PACK TOTAL JOINT (CUSTOM PROCEDURE TRAY) ×2 IMPLANT
PENCIL SMOKE EVACUATOR (MISCELLANEOUS) IMPLANT
PINN SECTOR W/GRIP ACE CUP 56 (Hips) ×2 IMPLANT
PINNACLE ALTRX PLUS 4 N 36X56 (Hips) ×2 IMPLANT
PROTECTOR NERVE ULNAR (MISCELLANEOUS) ×2 IMPLANT
RETRIEVER SUT HEWSON (MISCELLANEOUS) ×2 IMPLANT
SCREW 6.5MMX25MM (Screw) ×2 IMPLANT
SROM M HEAD 36MM PLUS 1.5 (Hips) ×2 IMPLANT
STEM HIP W/DUOFIX (Stem) ×2 IMPLANT
SUCTION FRAZIER HANDLE 12FR (TUBING) ×1
SUCTION TUBE FRAZIER 12FR DISP (TUBING) ×1 IMPLANT
SUT FIBERWIRE #2 38 REV NDL BL (SUTURE) ×6
SUT VIC AB 0 CT1 36 (SUTURE) ×2 IMPLANT
SUT VIC AB 1 CT1 36 (SUTURE) ×4 IMPLANT
SUT VIC AB 2-0 CT1 27 (SUTURE) ×2
SUT VIC AB 2-0 CT1 TAPERPNT 27 (SUTURE) ×2 IMPLANT
SUT VIC AB 3-0 SH 8-18 (SUTURE) ×2 IMPLANT
SUTURE FIBERWR#2 38 REV NDL BL (SUTURE) ×3 IMPLANT
SYR CONTROL 10ML LL (SYRINGE) ×4 IMPLANT
TOWEL OR 17X26 10 PK STRL BLUE (TOWEL DISPOSABLE) ×2 IMPLANT
TRAY FOLEY MTR SLVR 16FR STAT (SET/KITS/TRAYS/PACK) ×2 IMPLANT
TUBE SUCTION HIGH CAP CLEAR NV (SUCTIONS) ×2 IMPLANT
WATER STERILE IRR 1000ML POUR (IV SOLUTION) ×4 IMPLANT

## 2020-07-20 NOTE — Anesthesia Postprocedure Evaluation (Signed)
Anesthesia Post Note  Patient: Larry Miller  Procedure(s) Performed: TOTAL HIP ARTHROPLASTY (Right Hip)     Patient location during evaluation: SICU Anesthesia Type: Spinal Level of consciousness: sedated Pain management: pain level controlled Vital Signs Assessment: post-procedure vital signs reviewed and stable Respiratory status: patient remains intubated per anesthesia plan Cardiovascular status: stable Postop Assessment: no apparent nausea or vomiting Anesthetic complications: no   No complications documented.  Last Vitals:  Vitals:   07/20/20 1435 07/20/20 1525  BP: 119/66 111/81  Pulse: (!) 50 (!) 41  Resp: 19 15  Temp: (!) 36.4 C (!) 36.3 C  SpO2: 100% 99%    Last Pain:  Vitals:   07/20/20 1525  TempSrc: Oral  PainSc:                  Larry Miller

## 2020-07-20 NOTE — Anesthesia Procedure Notes (Signed)
Procedure Name: MAC Date/Time: 07/20/2020 10:51 AM Performed by: Lissa Morales, CRNA Pre-anesthesia Checklist: Patient identified, Emergency Drugs available, Patient being monitored and Timeout performed Patient Re-evaluated:Patient Re-evaluated prior to induction Oxygen Delivery Method: Simple face mask Placement Confirmation: positive ETCO2

## 2020-07-20 NOTE — Op Note (Signed)
07/20/2020  12:41 PM  PATIENT:  Larry Miller   MRN: [id number]  PRE-OPERATIVE DIAGNOSIS: Right hip primary localized osteoarthritis  POST-OPERATIVE DIAGNOSIS:  same  PROCEDURE:  Procedure(s): TOTAL HIP ARTHROPLASTY  PREOPERATIVE INDICATIONS:    Larry Miller is an 70 y.o. male who has a diagnosis of right hip primary localized osteoarthritis and elected for surgical management after failing conservative treatment.  The risks benefits and alternatives were discussed with the patient including but not limited to the risks of nonoperative treatment, versus surgical intervention including infection, bleeding, nerve injury, periprosthetic fracture, the need for revision surgery, dislocation, leg length discrepancy, blood clots, cardiopulmonary complications, morbidity, mortality, among others, and they were willing to proceed.     OPERATIVE REPORT     SURGEON:  Marchia Bond, MD    ASSISTANT:  Merlene Pulling, PA-C, (Present throughout the entire procedure,  necessary for completion of procedure in a timely manner, assisting with retraction, instrumentation, and closure)     ANESTHESIA: Spinal with Foley catheter  ESTIMATED BLOOD LOSS: 937 mL    COMPLICATIONS:  None.     UNIQUE ASPECTS OF THE CASE: His greater trochanter was somewhat overgrown, and challenging to get around.  The soft tissues felt like they were fairly contracted.  He was very stiff.  Access to the acetabulum was challenging.  My femoral neck cut was just below a thumbs breadth.  He had a fractured inferior osteophyte off of the acetabulum upon entry.  I did not completely medialize the cup, and it was difficult to see the anterior wall, but the version felt appropriate.  I reamed line to line to a 56.  The head was fairly large, it measured almost 56 by itself.  I had 1 tooth showing on the broach for the 7, and worked to lateralize appropriately.  He was stable, and leg lengths felt equal, at first I thought he might be just a  little bit long, but with the 1.5 it felt appropriate.  I debated using a high offset, but because I did not medialize the cup, and the stem was still slightly proud, I did not feel that a high offset was necessary.  The reduction was challenging.  COMPONENTS:  [employer] fit femur size 7 with a 36 mm + 1.5 metallic head ball and a Gription Acetabular shell size 56, with a single cancellous screw for backup fixation, with an apex hole eliminator and a +4 neutral polyethylene liner.    PROCEDURE IN DETAIL:   The patient was met in the holding area and  identified.  The appropriate hip was identified and marked at the operative site.  The patient was then transported to the OR  and  placed under anesthesia.  At that point, the patient was  placed in the lateral decubitus position with the operative side up and  secured to the operating room table and all bony prominences padded.     The operative lower extremity was prepped from the iliac crest to the distal leg.  Sterile draping was performed.  Time out was performed prior to incision.      A routine posterolateral approach was utilized via sharp dissection  carried down to the subcutaneous tissue.  Gross bleeders were Bovie coagulated.  The iliotibial band was identified and incised along the length of the skin incision.  Self-retaining retractors were  inserted.  With the hip internally rotated, the short external rotators  were identified. The piriformis and capsule was tagged  with FiberWire, and the hip capsule released in a T-type fashion.  The femoral neck was exposed, and I resected the femoral neck using the appropriate jig. This was performed at approximately a thumb's breadth above the lesser trochanter.    I then exposed the deep acetabulum, cleared out any tissue including the ligamentum teres.  A wing retractor was placed.  After adequate visualization, I excised the labrum, and then sequentially reamed.  I placed the trial  acetabulum, which seated nicely, and then impacted the real cup into place.  Appropriate version and inclination was confirmed clinically matching their bony anatomy, and also with the use of the jig.  I placed a cancellous screw to augment fixation.  A trial polyethylene liner was placed and the wing retractor removed.    I then prepared the proximal femur using the cookie-cutter, the lateralizing reamer, and then sequentially reamed and broached.  A trial broach, neck, and head was utilized, and I reduced the hip and it was found to have excellent stability with functional range of motion. The trial components were then removed, and the real polyethylene liner was placed.  I then impacted the real femoral prosthesis into place into the appropriate version, slightly anteverted to the normal anatomy, and I impacted the real head ball into place. The hip was then reduced and taken through functional range of motion and found to have excellent stability. Leg lengths were restored.  I then used a 2 mm drill bits to pass the FiberWire suture from the capsule and piriformis through the greater trochanter, and secured this. Excellent posterior capsular repair was achieved. I also closed the T in the capsule.  I then irrigated the hip copiously again with pulse lavage, and repaired the fascia with Vicryl, followed by Vicryl for the subcutaneous tissue, Monocryl for the skin, Steri-Strips and sterile gauze. The wounds were injected. The patient was then awakened and returned to PACU in stable and satisfactory condition. There were no complications.  Marchia Bond, MD Orthopedic Surgeon [phone]   07/20/2020 12:41 PM

## 2020-07-20 NOTE — Discharge Instructions (Signed)
INSTRUCTIONS AFTER JOINT REPLACEMENT   o Remove items at home which could result in a fall. This includes throw rugs or furniture in walking pathways o ICE to the affected joint every three hours while awake for 30 minutes at a time, for at least the first 3-5 days, and then as needed for pain and swelling.  Continue to use ice for pain and swelling. You may notice swelling that will progress down to the foot and ankle.  This is normal after surgery.  Elevate your leg when you are not up walking on it.   o Continue to use the breathing machine you got in the hospital (incentive spirometer) which will help keep your temperature down.  It is common for your temperature to cycle up and down following surgery, especially at night when you are not up moving around and exerting yourself.  The breathing machine keeps your lungs expanded and your temperature down.   DIET:  As you were doing prior to hospitalization, we recommend a well-balanced diet.  DRESSING / WOUND CARE / SHOWERING  You may change your dressing 3-5 days after surgery.  Then change the dressing every day with sterile gauze.  Please use good hand washing techniques before changing the dressing.  Do not use any lotions or creams on the incision until instructed by your surgeon.  ACTIVITY  o Increase activity slowly as tolerated, but follow the weight bearing instructions below.   o No driving for 6 weeks or until further direction given by your physician.  You cannot drive while taking narcotics.  o No lifting or carrying greater than 10 lbs. until further directed by your surgeon. o Avoid periods of inactivity such as sitting longer than an hour when not asleep. This helps prevent blood clots.  o You may return to work once you are authorized by your doctor.     WEIGHT BEARING   Weight bearing as tolerated with assist device (walker, cane, etc) as directed, use it as long as suggested by your surgeon or therapist, typically at  least 4-6 weeks.   EXERCISES  Results after joint replacement surgery are often greatly improved when you follow the exercise, range of motion and muscle strengthening exercises prescribed by your doctor. Safety measures are also important to protect the joint from further injury. Any time any of these exercises cause you to have increased pain or swelling, decrease what you are doing until you are comfortable again and then slowly increase them. If you have problems or questions, call your caregiver or physical therapist for advice.   Rehabilitation is important following a joint replacement. After just a few days of immobilization, the muscles of the leg can become weakened and shrink (atrophy).  These exercises are designed to build up the tone and strength of the thigh and leg muscles and to improve motion. Often times heat used for twenty to thirty minutes before working out will loosen up your tissues and help with improving the range of motion but do not use heat for the first two weeks following surgery (sometimes heat can increase post-operative swelling).   These exercises can be done on a training (exercise) mat, on the floor, on a table or on a bed. Use whatever works the best and is most comfortable for you.    Use music or television while you are exercising so that the exercises are a pleasant break in your day. This will make your life better with the exercises acting as a break   in your routine that you can look forward to.   Perform all exercises about fifteen times, three times per day or as directed.  You should exercise both the operative leg and the other leg as well.  Exercises include:   . Quad Sets - Tighten up the muscle on the front of the thigh (Quad) and hold for 5-10 seconds.   . Straight Leg Raises - With your knee straight (if you were given a brace, keep it on), lift the leg to 60 degrees, hold for 3 seconds, and slowly lower the leg.  Perform this exercise against  resistance later as your leg gets stronger.  . Leg Slides: Lying on your back, slowly slide your foot toward your buttocks, bending your knee up off the floor (only go as far as is comfortable). Then slowly slide your foot back down until your leg is flat on the floor again.  . Angel Wings: Lying on your back spread your legs to the side as far apart as you can without causing discomfort.  . Hamstring Strength:  Lying on your back, push your heel against the floor with your leg straight by tightening up the muscles of your buttocks.  Repeat, but this time bend your knee to a comfortable angle, and push your heel against the floor.  You may put a pillow under the heel to make it more comfortable if necessary.   A rehabilitation program following joint replacement surgery can speed recovery and prevent re-injury in the future due to weakened muscles. Contact your doctor or a physical therapist for more information on knee rehabilitation.    CONSTIPATION  Constipation is defined medically as fewer than three stools per week and severe constipation as less than one stool per week.  Even if you have a regular bowel pattern at home, your normal regimen is likely to be disrupted due to multiple reasons following surgery.  Combination of anesthesia, postoperative narcotics, change in appetite and fluid intake all can affect your bowels.   YOU MUST use at least one of the following options; they are listed in order of increasing strength to get the job done.  They are all available over the counter, and you may need to use some, POSSIBLY even all of these options:    Drink plenty of fluids (prune juice may be helpful) and high fiber foods Colace 100 mg by mouth twice a day  Senokot for constipation as directed and as needed Dulcolax (bisacodyl), take with full glass of water  Miralax (polyethylene glycol) once or twice a day as needed.  If you have tried all these things and are unable to have a bowel  movement in the first 3-4 days after surgery call either your surgeon or your primary doctor.    If you experience loose stools or diarrhea, hold the medications until you stool forms back up.  If your symptoms do not get better within 1 week or if they get worse, check with your doctor.  If you experience "the worst abdominal pain ever" or develop nausea or vomiting, please contact the office immediately for further recommendations for treatment.   ITCHING:  If you experience itching with your medications, try taking only a single pain pill, or even half a pain pill at a time.  You can also use Benadryl over the counter for itching or also to help with sleep.   TED HOSE STOCKINGS:  Use stockings on both legs until for at least 2 weeks or as   directed by physician office. They may be removed at night for sleeping.  MEDICATIONS:  See your medication summary on the "After Visit Summary" that nursing will review with you.  You may have some home medications which will be placed on hold until you complete the course of blood thinner medication.  It is important for you to complete the blood thinner medication as prescribed.  PRECAUTIONS:  If you experience chest pain or shortness of breath - call 911 immediately for transfer to the hospital emergency department.   If you develop a fever greater that 101 F, purulent drainage from wound, increased redness or drainage from wound, foul odor from the wound/dressing, or calf pain - CONTACT YOUR SURGEON.                                                   FOLLOW-UP APPOINTMENTS:  If you do not already have a post-op appointment, please call the office for an appointment to be seen by your surgeon.  Guidelines for how soon to be seen are listed in your "After Visit Summary", but are typically between 1-4 weeks after surgery.  DENTAL ANTIBIOTICS:  In most cases prophylactic antibiotics for Dental procdeures after total joint surgery are not  necessary.  Exceptions are as follows:  1. History of prior total joint infection  2. Severely immunocompromised (Organ Transplant, cancer chemotherapy, Rheumatoid biologic meds such as Humera)  3. Poorly controlled diabetes (A1C &gt; 8.0, blood glucose over 200)  If you have one of these conditions, contact your surgeon for an antibiotic prescription, prior to your dental procedure.   MAKE SURE YOU:  . Understand these instructions.  . Get help right away if you are not doing well or get worse.    Thank you for letting us be a part of your medical care team.  It is a privilege we respect greatly.  We hope these instructions will help you stay on track for a fast and full recovery!   

## 2020-07-20 NOTE — Plan of Care (Signed)
  Problem: Activity: Goal: Ability to avoid complications of mobility impairment will improve Outcome: Progressing   Problem: Activity: Goal: Ability to tolerate increased activity will improve Outcome: Progressing   Problem: Clinical Measurements: Goal: Postoperative complications will be avoided or minimized Outcome: Progressing   Problem: Pain Management: Goal: Pain level will decrease with appropriate interventions Outcome: Progressing   Problem: Skin Integrity: Goal: Will show signs of wound healing Outcome: Progressing   

## 2020-07-20 NOTE — Interval H&P Note (Signed)
History and Physical Interval Note:  07/20/2020 9:38 AM  Larry Miller  has presented today for surgery, with the diagnosis of djd right hip.  The various methods of treatment have been discussed with the patient and family. After consideration of risks, benefits and other options for treatment, the patient has consented to  Procedure(s): TOTAL HIP ARTHROPLASTY (Right) as a surgical intervention.  The patient's history has been reviewed, patient examined, no change in status, stable for surgery.  I have reviewed the patient's chart and labs.  Questions were answered to the patient's satisfaction.     Eulas Post

## 2020-07-20 NOTE — H&P (Signed)
HIP ARTHROPLASTY ADMISSION H&P  Patient ID: Larry Miller MRN: [id number] DOB/AGE: 70-Apr-1952 70 y.o.  Chief Complaint: right hip pain.  Planned Procedure Date: 07/20/20 Medical Clearance by Donzetta Sprung MD   HPI: Larry Miller is a 70 y.o. male who presents for evaluation of djd right hip. The patient has a history of pain and functional disability in the right hip due to arthritis and has failed non-surgical conservative treatments for greater than 12 weeks to include NSAID's and/or analgesics and activity modification.  Onset of symptoms was gradual, starting 1 years ago with rapidlly worsening course since that time. The patient noted no past surgery on the right hip.  Patient currently rates pain at 7 out of 10 with activity. Patient has night pain, worsening of pain with activity and weight bearing and pain that interferes with activities of daily living.  Patient has evidence of joint space narrowing by imaging studies.  There is no active infection.  Past Medical History:  Diagnosis Date  . Arthritis   . GERD (gastroesophageal reflux disease)   . History of kidney stones   . Hypercholesterolemia   . Paralysis agitans (HCC)   . Parkinson disease (HCC)   . Sleep apnea    Past Surgical History:  Procedure Laterality Date  . ROTATOR CUFF REPAIR Left 2020   No Known Allergies Prior to Admission medications   Medication Sig Start Date End Date Taking? Authorizing Provider  aspirin EC 81 MG tablet Take 81 mg by mouth daily. Swallow whole.   Yes [provider]  atorvastatin (LIPITOR) 10 MG tablet Take 10 mg by mouth once a week. 06/02/20  Yes [provider]  Cholecalciferol (VITAMIN D3) 50 MCG (2000 UT) TABS Take 2,000 Units by mouth daily.   Yes [provider]  famotidine (PEPCID) 40 MG tablet Take 40 mg by mouth 2 (two) times daily. 06/15/20  Yes [provider]  Menaquinone-7 (VITAMIN K2 PO) Take 1 capsule by mouth daily. Mk-7   Yes [provider]  Multiple Vitamins-Minerals (HAIR/SKIN/NAILS) CAPS Take 1 tablet by mouth daily at 12 noon.   Yes [provider]  Multiple Vitamins-Minerals (MULTIVITAMIN WITH MINERALS) tablet Take 1 tablet by mouth daily. essential one vitamins   Yes [provider]  pantoprazole (PROTONIX) 40 MG tablet Take 40 mg by mouth daily. 06/19/20  Yes [provider]  ropinirole (REQUIP) 5 MG tablet Take 5 mg by mouth 3 (three) times daily. 04/07/20  Yes [provider]  RYTARY 48.75-195 MG CPCR Take 2 capsules by mouth 3 (three) times daily. 06/30/20  Yes [provider]  tamsulosin (FLOMAX) 0.4 MG CAPS capsule Take 0.4 mg by mouth daily. 04/12/20  Yes [provider]   Social History   Socioeconomic History  . Marital status: Married    Spouse name: Not on file  . Number of children: Not on file  . Years of education: Not on file  . Highest education level: Not on file  Occupational History  . Not on file  Tobacco Use  . Smoking status: Former Games developer  . Smokeless tobacco: Never Used  . Tobacco comment: Quit 30 years ago  Vaping Use  . Vaping Use: Never used  Substance and Sexual Activity  . Alcohol use: Never  . Drug use: Never  . Sexual activity: Not on file  Other Topics Concern  . Not on file  Social History Narrative  . Not on file   Social Determinants of Health  Financial Resource Strain: Not on file  Food Insecurity: Not on file  Transportation Needs: Not on file  Physical Activity: Not on file  Stress: Not on file  Social Connections: Not on file   No family history on file.  ROS: Currently denies lightheadedness, dizziness, Fever, chills, CP, SOB.   No personal history of DVT, PE, MI, or CVA. No loose teeth or dentures All other systems have been reviewed and were otherwise currently negative with the exception of those mentioned in the HPI and as above.  Objective: Vitals: Ht: 6'1" Wt: 210.5 lbs Temp: 97.6 BP:  131/75 Pulse: 64 O2 98% on room air.   Physical Exam: General: Alert, NAD.  HEENT: EOMI, Good Neck Extension Pulm: No increased work of breathing.  Clear B/L A/P w/o crackle or wheeze. CV: RRR, No m/g/r appreciated  GI: soft, NT, ND. Normal bowel sounds Neuro: Neuro without gross focal deficit.  Sensation intact distally Skin: No lesions in the area of chief complaint MSK/Surgical Site: right Hip pain with passive internal and external rotation.  0-45 deg forward flexion. Can internally rotate 15 degrees.   5/5 strength.  NVI.  No shuffling when walking.  Imaging Review Plain radiographs demonstrate severe degenerative joint disease of the right hip.   The bone quality appears to be fair for age and reported activity level.  Preoperative templating of the joint replacement has been completed, documented, and submitted to the Operating Room personnel in order to optimize intra-operative equipment management.  Assessment: djd right hip Principal Problem:   Osteoarthritis of right hip   Plan: Plan for Procedure(s): TOTAL HIP ARTHROPLASTY  The patient history, physical exam, clinical judgement of the provider and imaging are consistent with end stage degenerative joint disease and total joint arthroplasty is deemed medically necessary. The treatment options including medical management, injection therapy, and arthroplasty were discussed at length. The risks and benefits of Procedure(s): TOTAL HIP ARTHROPLASTY were presented and reviewed.  The risks of nonoperative treatment, versus surgical intervention including but not limited to continued pain, aseptic loosening, stiffness, dislocation/subluxation, infection, bleeding, nerve injury, blood clots, cardiopulmonary complications, morbidity, mortality, among others were discussed. The patient verbalizes understanding and wishes to proceed with the plan.  Patient is being admitted for surgery, pain control, PT, prophylactic antibiotics, VTE  prophylaxis, progressive ambulation, ADL's and discharge planning.   Dental prophylaxis discussed and recommended for 2 years postoperatively.   The patient does meet the criteria for TXA which will be used perioperatively.    ASA 325 mg will be used postoperatively for DVT prophylaxis in addition to SCDs, and early ambulation.   The patient is planning to be discharged home with OPPT in care of wife and daughter    Larry Miller 07/20/2020 6:58 AM

## 2020-07-20 NOTE — Transfer of Care (Signed)
Immediate Anesthesia Transfer of Care Note  Patient: DOC MANDALA  Procedure(s) Performed: TOTAL HIP ARTHROPLASTY (Right Hip)  Patient Location: PACU  Anesthesia Type:Spinal  Level of Consciousness: awake, alert , oriented and patient cooperative  Airway & Oxygen Therapy: Patient Spontanous Breathing and Patient connected to face mask oxygen  Post-op Assessment: Report given to RN and Post -op Vital signs reviewed and stable  Post vital signs: stable  Last Vitals:  Vitals Value Taken Time  BP 111/91 07/20/20 1318  Temp 36.3 C 07/20/20 1318  Pulse 55 07/20/20 1324  Resp 16 07/20/20 1324  SpO2 99 % 07/20/20 1324  Vitals shown include unvalidated device data.  Last Pain:  Vitals:   07/20/20 1318  TempSrc:   PainSc: 0-No pain      Patients Stated Pain Goal: 3 (07/20/20 0900)  Complications: No complications documented.

## 2020-07-21 DIAGNOSIS — Z7982 Long term (current) use of aspirin: Secondary | ICD-10-CM | POA: Diagnosis not present

## 2020-07-21 DIAGNOSIS — Z87891 Personal history of nicotine dependence: Secondary | ICD-10-CM | POA: Diagnosis not present

## 2020-07-21 DIAGNOSIS — G20 Parkinson's disease: Secondary | ICD-10-CM | POA: Diagnosis not present

## 2020-07-21 DIAGNOSIS — M1611 Unilateral primary osteoarthritis, right hip: Secondary | ICD-10-CM | POA: Diagnosis not present

## 2020-07-21 LAB — CBC
HCT: 34.4 % — ABNORMAL LOW (ref 39.0–52.0)
Hemoglobin: 11.3 g/dL — ABNORMAL LOW (ref 13.0–17.0)
MCH: 31.3 pg (ref 26.0–34.0)
MCHC: 32.8 g/dL (ref 30.0–36.0)
MCV: 95.3 fL (ref 80.0–100.0)
Platelets: 195 10*3/uL (ref 150–400)
RBC: 3.61 MIL/uL — ABNORMAL LOW (ref 4.22–5.81)
RDW: 13.4 % (ref 11.5–15.5)
WBC: 11.5 10*3/uL — ABNORMAL HIGH (ref 4.0–10.5)
nRBC: 0 % (ref 0.0–0.2)

## 2020-07-21 LAB — BASIC METABOLIC PANEL
Anion gap: 10 (ref 5–15)
BUN: 17 mg/dL (ref 8–23)
CO2: 23 mmol/L (ref 22–32)
Calcium: 9 mg/dL (ref 8.9–10.3)
Chloride: 104 mmol/L (ref 98–111)
Creatinine, Ser: 0.87 mg/dL (ref 0.61–1.24)
GFR, Estimated: >60 mL/min (ref >60)
Glucose, Bld: 121 mg/dL — ABNORMAL HIGH (ref 70–99)
Potassium: 3.9 mmol/L (ref 3.5–5.1)
Sodium: 137 mmol/L (ref 135–145)

## 2020-07-21 MED ORDER — ASPIRIN EC 325 MG PO TBEC: 325.0000 mg | DELAYED_RELEASE_TABLET | Freq: Two times a day (BID) | ORAL | 0 refills | Status: DC

## 2020-07-21 MED ORDER — OXYCODONE HCL 5 MG PO TABS: 5.0000 mg | ORAL_TABLET | ORAL | 0 refills | Status: DC | PRN

## 2020-07-21 MED ORDER — SENNA-DOCUSATE SODIUM 8.6-50 MG PO TABS: 2.0000 | ORAL_TABLET | Freq: Every day | ORAL | 1 refills | Status: DC

## 2020-07-21 MED ORDER — ONDANSETRON HCL 4 MG PO TABS: 4.0000 mg | ORAL_TABLET | Freq: Three times a day (TID) | ORAL | 0 refills | Status: DC | PRN

## 2020-07-21 NOTE — Progress Notes (Signed)
Subjective: 1 Day Post-Op s/p Procedure(s): TOTAL HIP ARTHROPLASTY   Patient is alert, oriented, Pain is mild-moderate, worse with movement. Denies chest pain, SOB, Calf pain. No nausea/vomiting. No other complaints.Eager to leave hospital today.    Objective:  PE: VITALS:   Vitals:   07/20/20 1727 07/20/20 2103 07/21/20 0123 07/21/20 0522  BP: 131/74 132/68 (!) 105/54 (!) 121/53  Pulse: (!) 53 (!) 54 (!) 51 (!) 53  Resp: 16 16 16 16   Temp: (!) 97.5 F (36.4 C) 97.9 F (36.6 C) 98.4 F (36.9 C) 97.9 F (36.6 C)  TempSrc: Oral Oral Oral Oral  SpO2: 97% 100% 98% 96%  Weight:      Height:        ABD soft Neurovascular intact Sensation intact distally Intact pulses distally Dorsiflexion/Plantar flexion intact Incision: dressing C/D/I  LABS  Results for orders placed or performed during the hospital encounter of 07/20/20 (from the past 24 hour(s))  CBC     Status: Abnormal   Collection Time: 07/21/20  3:18 AM  Result Value Ref Range   WBC 11.5 (H) 4.0 - 10.5 K/uL   RBC 3.61 (L) 4.22 - 5.81 MIL/uL   Hemoglobin 11.3 (L) 13.0 - 17.0 g/dL   HCT 07/23/20 (L) 82.5 - 05.3 %   MCV 95.3 80.0 - 100.0 fL   MCH 31.3 26.0 - 34.0 pg   MCHC 32.8 30.0 - 36.0 g/dL   RDW 97.6 73.4 - 19.3 %   Platelets 195 150 - 400 K/uL   nRBC 0.0 0.0 - 0.2 %  Basic metabolic panel     Status: Abnormal   Collection Time: 07/21/20  3:18 AM  Result Value Ref Range   Sodium 137 135 - 145 mmol/L   Potassium 3.9 3.5 - 5.1 mmol/L   Chloride 104 98 - 111 mmol/L   CO2 23 22 - 32 mmol/L   Glucose, Bld 121 (H) 70 - 99 mg/dL   BUN 17 8 - 23 mg/dL   Creatinine, Ser 07/23/20 0.61 - 1.24 mg/dL   Calcium 9.0 8.9 - 2.40 mg/dL   GFR, Estimated 97.3 >53 mL/min   Anion gap 10 5 - 15    DG Pelvis Portable  Result Date: 07/20/2020 CLINICAL DATA:  Status post right total hip replacement. EXAM: PORTABLE PELVIS 1-2 VIEWS COMPARISON:  None. FINDINGS: The right acetabular and femoral components appear to be well  situated. Expected postoperative changes are noted in the surrounding soft tissues. IMPRESSION: Status post right total hip arthroplasty. Electronically Signed   By: 07/22/2020 M.D.   On: 07/20/2020 15:51   DG Hip Port Unilat With Pelvis 1V Right  Result Date: 07/20/2020 CLINICAL DATA:  Status post right hip replacement. EXAM: DG HIP (WITH OR WITHOUT PELVIS) 1V PORT RIGHT COMPARISON:  None. FINDINGS: The right acetabular and femoral components appear to be well situated. Expected postoperative changes are seen involving the surrounding soft tissues. IMPRESSION: Status post right total hip arthroplasty. Electronically Signed   By: 07/22/2020 M.D.   On: 07/20/2020 15:52    Assessment/Plan: Principal Problem:   S/P total right hip arthroplasty Active Problems:   Osteoarthritis of right hip  1 Day Post-Op s/p Procedure(s): TOTAL HIP ARTHROPLASTY  Weightbearing: WBAT RLE Insicional and dressing care: Reinforce dressings as needed VTE prophylaxis: Aspirin 325mg  BID x 30 days Pain control: continue current regimen Follow - up plan: 2 weeks with Dr. 07/22/2020 Dispo: Pending PT eval  Contact information:    [address], [place] [phone] A fter hours and holidays please check [email or web address] for group call information for [facility]  Armida Sans 07/21/2020, 8:26 AM

## 2020-07-21 NOTE — Discharge Summary (Signed)
Discharge Summary  Patient ID: Larry Miller MRN: [id number] DOB/AGE: 1951/04/19 70 y.o.  Admit date: 07/20/2020 Discharge date: 07/21/2020  Admission Diagnoses:  Right hip osteoarthritis   Discharge Diagnoses:  Principal Problem:   S/P total right hip arthroplasty Active Problems:   Osteoarthritis of right hip   Past Medical History:  Diagnosis Date  . Arthritis   . GERD (gastroesophageal reflux disease)   . History of kidney stones   . Hypercholesterolemia   . Paralysis agitans (HCC)   . Parkinson disease (HCC)   . Sleep apnea     Surgeries: Procedure(s): TOTAL HIP ARTHROPLASTY on 07/20/2020   Consultants (if any):   Discharged Condition: Improved  Hospital Course: Larry Miller is an 70 y.o. male who was admitted 07/20/2020 with a diagnosis of right hip osteoarthritis and went to the operating room on 07/20/2020 and underwent the above named procedures.    He was given perioperative antibiotics:  Anti-infectives (From admission, onward)   Start     Dose/Rate Route Frequency Ordered Stop   07/20/20 1700  ceFAZolin (ANCEF) IVPB 2g/100 mL premix        2 g 200 mL/hr over 30 Minutes Intravenous Every 6 hours 07/20/20 1352 07/20/20 2352   07/20/20 0830  ceFAZolin (ANCEF) IVPB 2g/100 mL premix        2 g 200 mL/hr over 30 Minutes Intravenous On call to O.R. 07/20/20 0827 07/20/20 1053    .  He was given sequential compression devices, early ambulation, and aspirin for DVT prophylaxis.  He benefited maximally from the hospital stay and there were no complications.    Recent vital signs:  Vitals:   07/21/20 0905 07/21/20 1429  BP: (!) 129/58 129/61  Pulse: 74 (!) 57  Resp: 16   Temp: 98 F (36.7 C) 97.7 F (36.5 C)  SpO2: 98% 97%    Recent laboratory studies:  Lab Results  Component Value Date   HGB 11.3 (L) 07/21/2020   HGB 13.9 07/13/2020   Lab Results  Component Value Date   WBC 11.5 (H) 07/21/2020   PLT 195 07/21/2020   No results found for:  INR Lab Results  Component Value Date   NA 137 07/21/2020   K 3.9 07/21/2020   CL 104 07/21/2020   CO2 23 07/21/2020   BUN 17 07/21/2020   CREATININE 0.87 07/21/2020   GLUCOSE 121 (H) 07/21/2020    Discharge Medications:   Allergies as of 07/21/2020   No Known Allergies     Medication List    TAKE these medications   aspirin EC 325 MG tablet Take 1 tablet (325 mg total) by mouth 2 (two) times daily. What changed:   medication strength  how much to take  when to take this  additional instructions   atorvastatin 10 MG tablet Commonly known as: LIPITOR Take 10 mg by mouth once a week.   famotidine 40 MG tablet Commonly known as: PEPCID Take 40 mg by mouth 2 (two) times daily.   multivitamin with minerals tablet Take 1 tablet by mouth daily. essential one vitamins   Hair/Skin/Nails Caps Take 1 tablet by mouth daily at 12 noon.   ondansetron 4 MG tablet Commonly known as: Zofran Take 1 tablet (4 mg total) by mouth every 8 (eight) hours as needed for nausea or vomiting.   oxyCODONE 5 MG immediate release tablet Commonly known as: Roxicodone Take 1 tablet (5 mg total) by mouth every 4 (four) hours as needed for severe pain.  pantoprazole 40 MG tablet Commonly known as: PROTONIX Take 40 mg by mouth daily.   ropinirole 5 MG tablet Commonly known as: REQUIP Take 5 mg by mouth 3 (three) times daily.   Rytary 48.75-195 MG Cpcr Generic drug: Carbidopa-Levodopa ER Take 2 capsules by mouth 3 (three) times daily.   sennosides-docusate sodium 8.6-50 MG tablet Commonly known as: SENOKOT-S Take 2 tablets by mouth daily.   tamsulosin 0.4 MG Caps capsule Commonly known as: FLOMAX Take 0.4 mg by mouth daily.   Vitamin D3 50 MCG (2000 UT) Tabs Take 2,000 Units by mouth daily.   VITAMIN K2 PO Take 1 capsule by mouth daily. Mk-7       Diagnostic Studies: DG Pelvis Portable  Result Date: 07/20/2020 CLINICAL DATA:  Status post right total hip replacement.  EXAM: PORTABLE PELVIS 1-2 VIEWS COMPARISON:  None. FINDINGS: The right acetabular and femoral components appear to be well situated. Expected postoperative changes are noted in the surrounding soft tissues. IMPRESSION: Status post right total hip arthroplasty. Electronically Signed   By: Lupita Raider M.D.   On: 07/20/2020 15:51   DG Hip Port Unilat With Pelvis 1V Right  Result Date: 07/20/2020 CLINICAL DATA:  Status post right hip replacement. EXAM: DG HIP (WITH OR WITHOUT PELVIS) 1V PORT RIGHT COMPARISON:  None. FINDINGS: The right acetabular and femoral components appear to be well situated. Expected postoperative changes are seen involving the surrounding soft tissues. IMPRESSION: Status post right total hip arthroplasty. Electronically Signed   By: Lupita Raider M.D.   On: 07/20/2020 15:52    Disposition: Discharge disposition: 01-Home or Self Care          Follow-up Information    Teryl Lucy, MD. Go on 08/02/2020.   Specialty: Orthopedic Surgery Why: Your appointment has been scheduled for 11:30   Contact information: [address] [place] [address] [phone]        [facility]. Go on 07/22/2020.   Why: Your appointment is scheduled for 9:30. please arrive a few minutes early to complete your paperwork  Contact information: [address]  [place], [place] [address]  [phone]               Signed: Armida Sans PA-C 07/21/2020, 3:20 PM

## 2020-07-21 NOTE — TOC Transition Note (Signed)
Transition of Care [facility]) - CM/SW Discharge Note   Patient Details  Name: Larry Miller MRN: [id number] Date of Birth: 02-22-1951  Transition of Care [facility]) CM/SW Contact:  Lennart Pall, LCSW Phone Number: 07/21/2020, 10:30 AM   Clinical Narrative:     Met briefly with pt (ortho bundle) and confirming he has received his rw and 3n1.  Plan for [place] in [place].  No further TOC needs.  Final next level of care: OP Rehab Barriers to Discharge: No Barriers Identified   Patient Goals and CMS Choice Patient states their goals for this hospitalization and ongoing recovery are:: go home      Discharge Placement                       Discharge Plan and Services                DME Arranged: Walker rolling,Bedside commode DME Agency: [place]                  Social Determinants of Health (SDOH) Interventions     Readmission Risk Interventions No flowsheet data found.

## 2020-07-21 NOTE — Evaluation (Signed)
Physical Therapy Evaluation Patient Details Name: Larry Miller MRN: [id number] DOB: 1950/09/04 Today's Date: 07/21/2020   History of Present Illness  s/p R posterior THA. PMH: Parkinson's  Clinical Impression  Pt is s/p THA resulting in the deficits listed below (see PT Problem List).  Pt feeling well, amb hallway distance with  Min/guard assist. Will see for a second session and pt will likely be abel to d/c home later  Pt will benefit from skilled PT to increase their independence and safety with mobility to allow discharge to the venue listed below.      Follow Up Recommendations Follow surgeon's recommendation for DC plan and follow-up therapies    Equipment Recommendations  Other (comment) (delivered)    Recommendations for Other Services       Precautions / Restrictions Precautions Precautions: Fall Restrictions Weight Bearing Restrictions: No      Mobility  Bed Mobility               General bed mobility comments: NT- on EOB on PT arrival    Transfers Overall transfer level: Needs assistance Equipment used: Rolling walker (2 wheeled) Transfers: Sit to/from Stand Sit to Stand: Min assist         General transfer comment: assist for to safety rise and transition to RW. cues for RLE position/THP  Ambulation/Gait Ambulation/Gait assistance: Min guard Gait Distance (Feet): 90 Feet   Gait Pattern/deviations: Step-to pattern;Decreased stance time - right     General Gait Details: cues for sequence, posture, RW position, THP with turns  Stairs            Wheelchair Mobility    Modified Rankin (Stroke Patients Only)       Balance                                             Pertinent Vitals/Pain Pain Assessment: 0-10 Pain Descriptors / Indicators: Sore;Discomfort Pain Intervention(s): Limited activity within patient's tolerance;Monitored during session    Home Living Family/patient expects to be discharged to::  Private residence Living Arrangements: Spouse/significant other;Children;Other relatives Available Help at Discharge: Family Type of Home: House Home Access: Stairs to enter   Secretary/administrator of Steps: 2   Home Equipment: None      Prior Function                 Hand Dominance        Extremity/Trunk Assessment   Upper Extremity Assessment Upper Extremity Assessment: Defer to OT evaluation    Lower Extremity Assessment Lower Extremity Assessment: RLE deficits/detail RLE Deficits / Details: grossly 3 to 3+/5       Communication   Communication: No difficulties  Cognition Arousal/Alertness: Awake/alert Behavior During Therapy: WFL for tasks assessed/performed Overall Cognitive Status: Within Functional Limits for tasks assessed                                        General Comments      Exercises Total Joint Exercises Ankle Circles/Pumps: AROM;Both;10 reps   Assessment/Plan    PT Assessment Patient needs continued PT services  PT Problem List Decreased strength;Decreased mobility;Decreased activity tolerance;Decreased range of motion;Pain;Decreased balance;Decreased knowledge of precautions       PT Treatment Interventions DME instruction;Therapeutic activities;Gait training;Stair training;Therapeutic exercise;Balance training;Patient/family education  PT Goals (Current goals can be found in the Care Plan section)  Acute Rehab PT Goals Patient Stated Goal: home today if possible PT Goal Formulation: With patient Time For Goal Achievement: 07/28/20 Potential to Achieve Goals: Good    Frequency 7X/week   Barriers to discharge        Co-evaluation               AM-PAC PT "6 Clicks" Mobility  Outcome Measure Help needed turning from your back to your side while in a flat bed without using bedrails?: A Little Help needed moving from lying on your back to sitting on the side of a flat bed without using bedrails?: A  Little Help needed moving to and from a bed to a chair (including a wheelchair)?: A Little Help needed standing up from a chair using your arms (e.g., wheelchair or bedside chair)?: A Little Help needed to walk in hospital room?: A Little Help needed climbing 3-5 steps with a railing? : A Little 6 Click Score: 18    End of Session Equipment Utilized During Treatment: Gait belt Activity Tolerance: Patient tolerated treatment well Patient left: in chair;with call bell/phone within reach;with chair alarm set Nurse Communication: Mobility status PT Visit Diagnosis: Other abnormalities of gait and mobility (R26.89)    Time: 6503-5465 PT Time Calculation (min) (ACUTE ONLY): 21 min   Charges:   PT Evaluation $PT Eval Low Complexity: 1 Low          Kian Ottaviano, PT  Acute Rehab Dept ([facility]) [phone] Pager [phone]  07/21/2020   [facility] 07/21/2020, 1:43 PM

## 2020-07-21 NOTE — Progress Notes (Signed)
07/21/20 1400  PT Visit Information  Last PT Received On 07/21/20  Pt progressing well this pm. Has ample support at home. Ready to d/c from PT standpoint with family assist prn. RN aware  Assistance Needed +1  History of Present Illness s/p R posterior THA. PMH: Parkinson's  Subjective Data  Patient Stated Goal home today if possible  Precautions  Precautions Fall;Posterior Hip  Restrictions  Weight Bearing Restrictions No  Other Position/Activity Restrictions WBAT  Pain Assessment  Pain Assessment 0-10  Pain Score 3  Pain Location right hip  Pain Descriptors / Indicators Sore;Discomfort  Pain Intervention(s) Limited activity within patient's tolerance;Monitored during session;Premedicated before session  Cognition  Arousal/Alertness Awake/alert  Behavior During Therapy WFL for tasks assessed/performed  Overall Cognitive Status Within Functional Limits for tasks assessed  Bed Mobility  Overal bed mobility Needs Assistance  Bed Mobility Sit to Supine  Sit to supine Min assist;Min guard  General bed mobility comments cues for THP, able to use gait belt as leg lifter  Transfers  Equipment used Rolling walker (2 wheeled)  Transfers Sit to/from Stand  Sit to Stand Min guard  General transfer comment cues for hand placement,  RLE position/THP  Ambulation/Gait  Ambulation/Gait assistance Min guard  Gait Distance (Feet) 140 Feet  Assistive device Rolling walker (2 wheeled)  Gait Pattern/deviations Step-to pattern;Decreased stance time - right  General Gait Details cues for sequence, posture, RW position, THP with turns  Stairs Yes  Stairs assistance Min guard  Stair Management One rail Right;One rail Left;Step to pattern;Sideways  Number of Stairs 3  General stair comments cues for sequence, min/guard for safety  Total Joint Exercises  Ankle Circles/Pumps AROM;Both;10 reps  Quad Sets AROM;Both;10 reps  Short Arc Quad AROM;10 reps;Right  Heel Slides AAROM;Right;10  reps;AROM  Hip ABduction/ADduction 10 reps;AAROM;Right  Long Arc Quad AROM;Right;10 reps;Seated  PT - End of Session  Equipment Utilized During Treatment Gait belt  Activity Tolerance Patient tolerated treatment well  Patient left with call bell/phone within reach;in bed;with bed alarm set  Nurse Communication Mobility status   PT - Assessment/Plan  PT Plan Current plan remains appropriate  PT Visit Diagnosis Other abnormalities of gait and mobility (R26.89)  PT Frequency (ACUTE ONLY) 7X/week  Follow Up Recommendations Follow surgeons recommendation for DC plan and follow-up therapies  AM-PAC PT "6 Clicks" Mobility Outcome Measure (Version 2)  Help needed turning from your back to your side while in a flat bed without using bedrails? 3  Help needed moving from lying on your back to sitting on the side of a flat bed without using bedrails? 3  Help needed moving to and from a bed to a chair (including a wheelchair)? 3  Help needed standing up from a chair using your arms (e.g., wheelchair or bedside chair)? 3  Help needed to walk in hospital room? 3  Help needed climbing 3-5 steps with a railing?  3  6 Click Score 18  Consider Recommendation of Discharge To: Home with [facility]  PT Goal Progression  Progress towards PT goals Progressing toward goals  Acute Rehab PT Goals  PT Goal Formulation With patient  Time For Goal Achievement 07/28/20  Potential to Achieve Goals Good  PT Time Calculation  PT Start Time (ACUTE ONLY) 1400  PT Stop Time (ACUTE ONLY) 1423  PT Time Calculation (min) (ACUTE ONLY) 23 min  PT General Charges  $$ ACUTE PT VISIT 1 Visit  PT Treatments  $Gait Training 8-22 mins  $Therapeutic Exercise 8-22  mins

## 2020-07-22 ENCOUNTER — Encounter (HOSPITAL_COMMUNITY): Payer: Self-pay | Admitting: Orthopedic Surgery

## 2020-07-22 DIAGNOSIS — M25551 Pain in right hip: Secondary | ICD-10-CM | POA: Diagnosis not present

## 2020-07-23 DIAGNOSIS — G4733 Obstructive sleep apnea (adult) (pediatric): Secondary | ICD-10-CM | POA: Diagnosis not present

## 2020-07-26 DIAGNOSIS — G20 Parkinson's disease: Secondary | ICD-10-CM | POA: Diagnosis not present

## 2020-07-26 DIAGNOSIS — N183 Chronic kidney disease, stage 3 unspecified: Secondary | ICD-10-CM | POA: Diagnosis not present

## 2020-07-26 DIAGNOSIS — F0280 Dementia in other diseases classified elsewhere without behavioral disturbance: Secondary | ICD-10-CM | POA: Diagnosis not present

## 2020-07-26 DIAGNOSIS — E7849 Other hyperlipidemia: Secondary | ICD-10-CM | POA: Diagnosis not present

## 2020-07-28 DIAGNOSIS — M25551 Pain in right hip: Secondary | ICD-10-CM | POA: Diagnosis not present

## 2020-07-30 DIAGNOSIS — M25551 Pain in right hip: Secondary | ICD-10-CM | POA: Diagnosis not present

## 2020-08-02 DIAGNOSIS — M1611 Unilateral primary osteoarthritis, right hip: Secondary | ICD-10-CM | POA: Diagnosis not present

## 2020-08-06 DIAGNOSIS — M25551 Pain in right hip: Secondary | ICD-10-CM | POA: Diagnosis not present

## 2020-08-09 DIAGNOSIS — M25551 Pain in right hip: Secondary | ICD-10-CM | POA: Diagnosis not present

## 2020-08-11 DIAGNOSIS — M25551 Pain in right hip: Secondary | ICD-10-CM | POA: Diagnosis not present

## 2020-08-13 DIAGNOSIS — M25551 Pain in right hip: Secondary | ICD-10-CM | POA: Diagnosis not present

## 2020-08-17 DIAGNOSIS — M25551 Pain in right hip: Secondary | ICD-10-CM | POA: Diagnosis not present

## 2020-08-18 DIAGNOSIS — M25551 Pain in right hip: Secondary | ICD-10-CM | POA: Diagnosis not present

## 2020-08-20 DIAGNOSIS — M1611 Unilateral primary osteoarthritis, right hip: Secondary | ICD-10-CM | POA: Diagnosis not present

## 2020-08-20 DIAGNOSIS — M25551 Pain in right hip: Secondary | ICD-10-CM | POA: Diagnosis not present

## 2020-08-20 DIAGNOSIS — G4733 Obstructive sleep apnea (adult) (pediatric): Secondary | ICD-10-CM | POA: Diagnosis not present

## 2020-08-23 DIAGNOSIS — M25551 Pain in right hip: Secondary | ICD-10-CM | POA: Diagnosis not present

## 2020-08-25 DIAGNOSIS — E7849 Other hyperlipidemia: Secondary | ICD-10-CM | POA: Diagnosis not present

## 2020-08-25 DIAGNOSIS — G20 Parkinson's disease: Secondary | ICD-10-CM | POA: Diagnosis not present

## 2020-08-25 DIAGNOSIS — N183 Chronic kidney disease, stage 3 unspecified: Secondary | ICD-10-CM | POA: Diagnosis not present

## 2020-08-25 DIAGNOSIS — F0280 Dementia in other diseases classified elsewhere without behavioral disturbance: Secondary | ICD-10-CM | POA: Diagnosis not present

## 2020-08-25 DIAGNOSIS — M25551 Pain in right hip: Secondary | ICD-10-CM | POA: Diagnosis not present

## 2020-08-27 DIAGNOSIS — M25551 Pain in right hip: Secondary | ICD-10-CM | POA: Diagnosis not present

## 2020-08-31 DIAGNOSIS — M25551 Pain in right hip: Secondary | ICD-10-CM | POA: Diagnosis not present

## 2020-09-03 DIAGNOSIS — M25551 Pain in right hip: Secondary | ICD-10-CM | POA: Diagnosis not present

## 2020-09-06 DIAGNOSIS — G20 Parkinson's disease: Secondary | ICD-10-CM | POA: Diagnosis not present

## 2020-09-06 DIAGNOSIS — M25551 Pain in right hip: Secondary | ICD-10-CM | POA: Diagnosis not present

## 2020-09-07 DIAGNOSIS — A6920 Lyme disease, unspecified: Secondary | ICD-10-CM | POA: Diagnosis not present

## 2020-09-08 DIAGNOSIS — M25551 Pain in right hip: Secondary | ICD-10-CM | POA: Diagnosis not present

## 2020-09-13 DIAGNOSIS — M25551 Pain in right hip: Secondary | ICD-10-CM | POA: Diagnosis not present

## 2020-09-16 DIAGNOSIS — M25551 Pain in right hip: Secondary | ICD-10-CM | POA: Diagnosis not present

## 2020-09-20 DIAGNOSIS — G4733 Obstructive sleep apnea (adult) (pediatric): Secondary | ICD-10-CM | POA: Diagnosis not present

## 2020-09-20 DIAGNOSIS — M25551 Pain in right hip: Secondary | ICD-10-CM | POA: Diagnosis not present

## 2020-09-23 DIAGNOSIS — M25551 Pain in right hip: Secondary | ICD-10-CM | POA: Diagnosis not present

## 2020-09-25 DIAGNOSIS — F0280 Dementia in other diseases classified elsewhere without behavioral disturbance: Secondary | ICD-10-CM | POA: Diagnosis not present

## 2020-09-25 DIAGNOSIS — E7849 Other hyperlipidemia: Secondary | ICD-10-CM | POA: Diagnosis not present

## 2020-09-25 DIAGNOSIS — G20 Parkinson's disease: Secondary | ICD-10-CM | POA: Diagnosis not present

## 2020-09-25 DIAGNOSIS — N183 Chronic kidney disease, stage 3 unspecified: Secondary | ICD-10-CM | POA: Diagnosis not present

## 2020-09-27 DIAGNOSIS — M25551 Pain in right hip: Secondary | ICD-10-CM | POA: Diagnosis not present

## 2020-09-30 DIAGNOSIS — M25551 Pain in right hip: Secondary | ICD-10-CM | POA: Diagnosis not present

## 2020-10-05 DIAGNOSIS — A6920 Lyme disease, unspecified: Secondary | ICD-10-CM | POA: Diagnosis not present

## 2020-10-05 DIAGNOSIS — N183 Chronic kidney disease, stage 3 unspecified: Secondary | ICD-10-CM | POA: Diagnosis not present

## 2020-10-05 DIAGNOSIS — E782 Mixed hyperlipidemia: Secondary | ICD-10-CM | POA: Diagnosis not present

## 2020-10-05 DIAGNOSIS — E7849 Other hyperlipidemia: Secondary | ICD-10-CM | POA: Diagnosis not present

## 2020-10-05 DIAGNOSIS — G20 Parkinson's disease: Secondary | ICD-10-CM | POA: Diagnosis not present

## 2020-10-06 DIAGNOSIS — M25551 Pain in right hip: Secondary | ICD-10-CM | POA: Diagnosis not present

## 2020-10-08 DIAGNOSIS — M25551 Pain in right hip: Secondary | ICD-10-CM | POA: Diagnosis not present

## 2020-10-11 DIAGNOSIS — G20 Parkinson's disease: Secondary | ICD-10-CM | POA: Diagnosis not present

## 2020-10-11 DIAGNOSIS — H9313 Tinnitus, bilateral: Secondary | ICD-10-CM | POA: Diagnosis not present

## 2020-10-11 DIAGNOSIS — K2100 Gastro-esophageal reflux disease with esophagitis, without bleeding: Secondary | ICD-10-CM | POA: Diagnosis not present

## 2020-10-11 DIAGNOSIS — Z7189 Other specified counseling: Secondary | ICD-10-CM | POA: Diagnosis not present

## 2020-10-11 DIAGNOSIS — E7849 Other hyperlipidemia: Secondary | ICD-10-CM | POA: Diagnosis not present

## 2020-10-11 DIAGNOSIS — M25551 Pain in right hip: Secondary | ICD-10-CM | POA: Diagnosis not present

## 2020-10-11 DIAGNOSIS — Z6828 Body mass index (BMI) 28.0-28.9, adult: Secondary | ICD-10-CM | POA: Diagnosis not present

## 2020-10-20 DIAGNOSIS — G4733 Obstructive sleep apnea (adult) (pediatric): Secondary | ICD-10-CM | POA: Diagnosis not present

## 2020-10-21 DIAGNOSIS — M25551 Pain in right hip: Secondary | ICD-10-CM | POA: Diagnosis not present

## 2020-10-23 DIAGNOSIS — J209 Acute bronchitis, unspecified: Secondary | ICD-10-CM | POA: Diagnosis not present

## 2020-10-23 DIAGNOSIS — G20 Parkinson's disease: Secondary | ICD-10-CM | POA: Diagnosis not present

## 2020-10-25 DIAGNOSIS — F0280 Dementia in other diseases classified elsewhere without behavioral disturbance: Secondary | ICD-10-CM | POA: Diagnosis not present

## 2020-10-25 DIAGNOSIS — E7849 Other hyperlipidemia: Secondary | ICD-10-CM | POA: Diagnosis not present

## 2020-10-25 DIAGNOSIS — G20 Parkinson's disease: Secondary | ICD-10-CM | POA: Diagnosis not present

## 2020-10-25 DIAGNOSIS — N183 Chronic kidney disease, stage 3 unspecified: Secondary | ICD-10-CM | POA: Diagnosis not present

## 2020-11-01 DIAGNOSIS — M1611 Unilateral primary osteoarthritis, right hip: Secondary | ICD-10-CM | POA: Diagnosis not present

## 2020-11-05 DIAGNOSIS — G4733 Obstructive sleep apnea (adult) (pediatric): Secondary | ICD-10-CM | POA: Diagnosis not present

## 2020-11-13 DIAGNOSIS — H1031 Unspecified acute conjunctivitis, right eye: Secondary | ICD-10-CM | POA: Diagnosis not present

## 2020-11-20 DIAGNOSIS — G4733 Obstructive sleep apnea (adult) (pediatric): Secondary | ICD-10-CM | POA: Diagnosis not present

## 2020-11-25 DIAGNOSIS — N183 Chronic kidney disease, stage 3 unspecified: Secondary | ICD-10-CM | POA: Diagnosis not present

## 2020-11-25 DIAGNOSIS — F0280 Dementia in other diseases classified elsewhere without behavioral disturbance: Secondary | ICD-10-CM | POA: Diagnosis not present

## 2020-11-25 DIAGNOSIS — E7849 Other hyperlipidemia: Secondary | ICD-10-CM | POA: Diagnosis not present

## 2020-11-25 DIAGNOSIS — G20 Parkinson's disease: Secondary | ICD-10-CM | POA: Diagnosis not present

## 2020-12-26 DIAGNOSIS — G20 Parkinson's disease: Secondary | ICD-10-CM | POA: Diagnosis not present

## 2020-12-26 DIAGNOSIS — E7849 Other hyperlipidemia: Secondary | ICD-10-CM | POA: Diagnosis not present

## 2020-12-26 DIAGNOSIS — N183 Chronic kidney disease, stage 3 unspecified: Secondary | ICD-10-CM | POA: Diagnosis not present

## 2020-12-26 DIAGNOSIS — F0280 Dementia in other diseases classified elsewhere without behavioral disturbance: Secondary | ICD-10-CM | POA: Diagnosis not present

## 2021-01-26 DIAGNOSIS — G20 Parkinson's disease: Secondary | ICD-10-CM | POA: Diagnosis not present

## 2021-01-26 DIAGNOSIS — E7849 Other hyperlipidemia: Secondary | ICD-10-CM | POA: Diagnosis not present

## 2021-01-26 DIAGNOSIS — F0280 Dementia in other diseases classified elsewhere without behavioral disturbance: Secondary | ICD-10-CM | POA: Diagnosis not present

## 2021-01-26 DIAGNOSIS — N183 Chronic kidney disease, stage 3 unspecified: Secondary | ICD-10-CM | POA: Diagnosis not present

## 2021-02-02 DIAGNOSIS — M1611 Unilateral primary osteoarthritis, right hip: Secondary | ICD-10-CM | POA: Diagnosis not present

## 2021-02-02 DIAGNOSIS — M545 Low back pain, unspecified: Secondary | ICD-10-CM | POA: Diagnosis not present

## 2021-02-03 DIAGNOSIS — Z1329 Encounter for screening for other suspected endocrine disorder: Secondary | ICD-10-CM | POA: Diagnosis not present

## 2021-02-03 DIAGNOSIS — E7849 Other hyperlipidemia: Secondary | ICD-10-CM | POA: Diagnosis not present

## 2021-02-03 DIAGNOSIS — K2100 Gastro-esophageal reflux disease with esophagitis, without bleeding: Secondary | ICD-10-CM | POA: Diagnosis not present

## 2021-02-03 DIAGNOSIS — E782 Mixed hyperlipidemia: Secondary | ICD-10-CM | POA: Diagnosis not present

## 2021-02-03 DIAGNOSIS — N183 Chronic kidney disease, stage 3 unspecified: Secondary | ICD-10-CM | POA: Diagnosis not present

## 2021-02-07 DIAGNOSIS — Z7189 Other specified counseling: Secondary | ICD-10-CM | POA: Diagnosis not present

## 2021-02-07 DIAGNOSIS — H9313 Tinnitus, bilateral: Secondary | ICD-10-CM | POA: Diagnosis not present

## 2021-02-07 DIAGNOSIS — E7849 Other hyperlipidemia: Secondary | ICD-10-CM | POA: Diagnosis not present

## 2021-02-07 DIAGNOSIS — Z23 Encounter for immunization: Secondary | ICD-10-CM | POA: Diagnosis not present

## 2021-02-07 DIAGNOSIS — K2100 Gastro-esophageal reflux disease with esophagitis, without bleeding: Secondary | ICD-10-CM | POA: Diagnosis not present

## 2021-02-07 DIAGNOSIS — G20 Parkinson's disease: Secondary | ICD-10-CM | POA: Diagnosis not present

## 2021-02-07 DIAGNOSIS — Z6827 Body mass index (BMI) 27.0-27.9, adult: Secondary | ICD-10-CM | POA: Diagnosis not present

## 2021-02-17 DIAGNOSIS — Z23 Encounter for immunization: Secondary | ICD-10-CM | POA: Diagnosis not present

## 2021-03-10 DIAGNOSIS — Z23 Encounter for immunization: Secondary | ICD-10-CM | POA: Diagnosis not present

## 2021-03-28 DIAGNOSIS — G20 Parkinson's disease: Secondary | ICD-10-CM | POA: Diagnosis not present

## 2021-03-28 DIAGNOSIS — N183 Chronic kidney disease, stage 3 unspecified: Secondary | ICD-10-CM | POA: Diagnosis not present

## 2021-03-28 DIAGNOSIS — F0280 Dementia in other diseases classified elsewhere without behavioral disturbance: Secondary | ICD-10-CM | POA: Diagnosis not present

## 2021-03-28 DIAGNOSIS — E7849 Other hyperlipidemia: Secondary | ICD-10-CM | POA: Diagnosis not present

## 2021-04-26 DIAGNOSIS — J018 Other acute sinusitis: Secondary | ICD-10-CM | POA: Diagnosis not present

## 2021-05-31 IMAGING — DX DG HIP (WITH OR WITHOUT PELVIS) 1V PORT*R*
1 series · 1 of 1 positions shown · non-contrast
Comparison: None.

CLINICAL DATA: Status post right hip replacement.

EXAM:
DG HIP (WITH OR WITHOUT PELVIS) 1V PORT RIGHT

[hip lat]
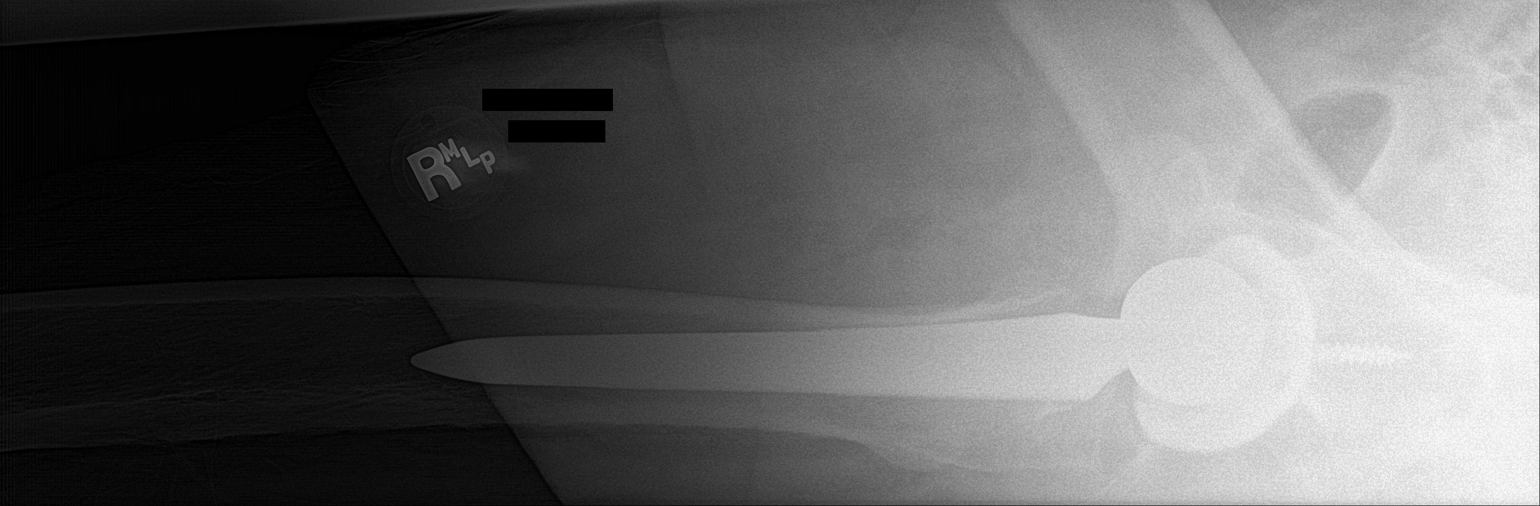

[1 of 1 positions shown; findings below may reference images not displayed]

FINDINGS: The right acetabular and femoral components appear to be well
situated. Expected postoperative changes are seen involving the
surrounding soft tissues.
IMPRESSION: Status post right total hip arthroplasty.

## 2021-05-31 IMAGING — DX DG HIP (WITH OR WITHOUT PELVIS) 1V PORT*R*
1 series · 1 of 1 positions shown · non-contrast
Comparison: None.

CLINICAL DATA: Status post right hip replacement.

EXAM:
DG HIP (WITH OR WITHOUT PELVIS) 1V PORT RIGHT

[hip ap]
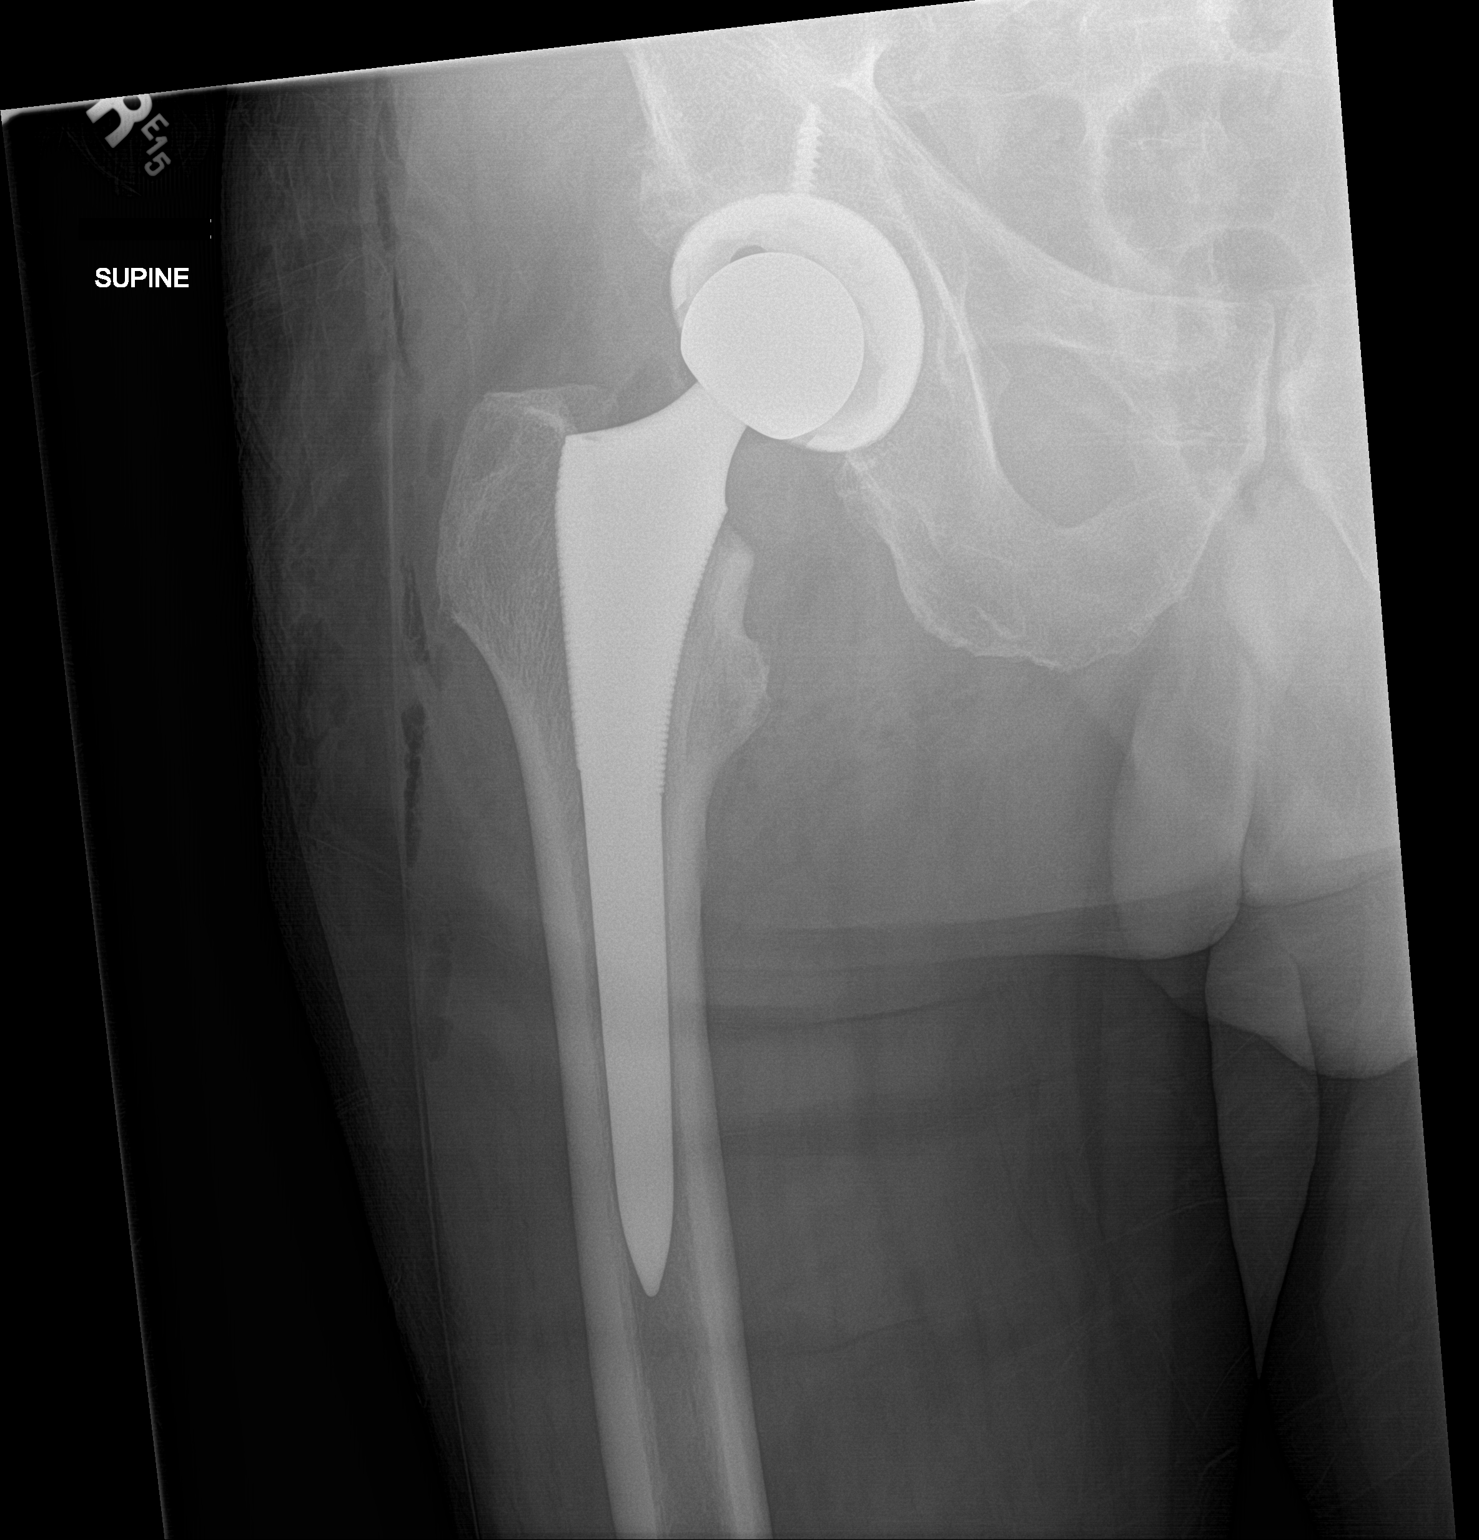

[1 of 1 positions shown; findings below may reference images not displayed]

FINDINGS: The right acetabular and femoral components appear to be well
situated. Expected postoperative changes are seen involving the
surrounding soft tissues.
IMPRESSION: Status post right total hip arthroplasty.

## 2021-05-31 IMAGING — DX DG PORTABLE PELVIS
1 series · 1 of 1 positions shown · non-contrast
Comparison: None.

CLINICAL DATA: Status post right total hip replacement.

EXAM:
PORTABLE PELVIS 1-2 VIEWS

[pelvis ap]
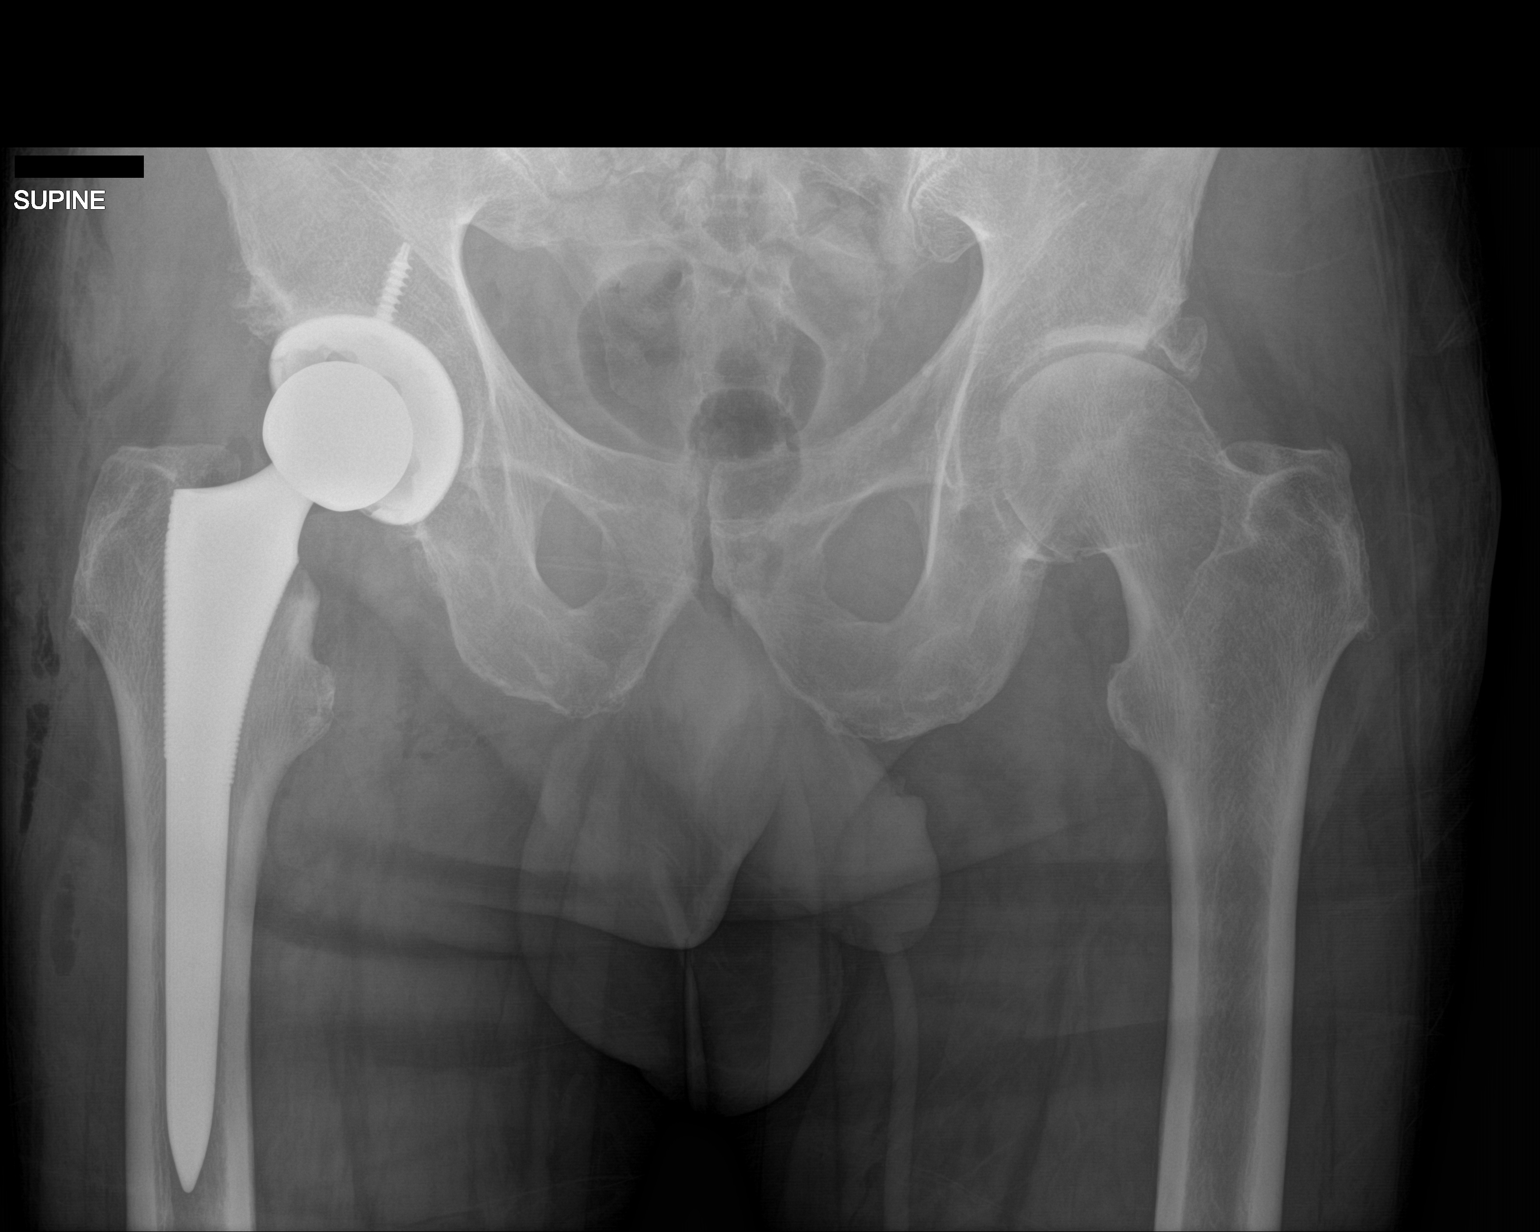

[1 of 1 positions shown; findings below may reference images not displayed]

FINDINGS: The right acetabular and femoral components appear to be well
situated. Expected postoperative changes are noted in the
surrounding soft tissues.
IMPRESSION: Status post right total hip arthroplasty.

## 2021-06-06 DIAGNOSIS — R079 Chest pain, unspecified: Secondary | ICD-10-CM | POA: Diagnosis not present

## 2021-06-06 DIAGNOSIS — G20 Parkinson's disease: Secondary | ICD-10-CM | POA: Diagnosis not present

## 2021-06-08 DIAGNOSIS — J329 Chronic sinusitis, unspecified: Secondary | ICD-10-CM | POA: Diagnosis not present

## 2021-07-05 DIAGNOSIS — E782 Mixed hyperlipidemia: Secondary | ICD-10-CM | POA: Diagnosis not present

## 2021-07-05 DIAGNOSIS — E7849 Other hyperlipidemia: Secondary | ICD-10-CM | POA: Diagnosis not present

## 2021-07-05 DIAGNOSIS — Z1329 Encounter for screening for other suspected endocrine disorder: Secondary | ICD-10-CM | POA: Diagnosis not present

## 2021-07-05 DIAGNOSIS — K2100 Gastro-esophageal reflux disease with esophagitis, without bleeding: Secondary | ICD-10-CM | POA: Diagnosis not present

## 2021-07-05 DIAGNOSIS — Z1159 Encounter for screening for other viral diseases: Secondary | ICD-10-CM | POA: Diagnosis not present

## 2021-07-05 DIAGNOSIS — Z125 Encounter for screening for malignant neoplasm of prostate: Secondary | ICD-10-CM | POA: Diagnosis not present

## 2021-07-05 DIAGNOSIS — N183 Chronic kidney disease, stage 3 unspecified: Secondary | ICD-10-CM | POA: Diagnosis not present

## 2021-07-07 DIAGNOSIS — Z0001 Encounter for general adult medical examination with abnormal findings: Secondary | ICD-10-CM | POA: Diagnosis not present

## 2021-07-07 DIAGNOSIS — K2100 Gastro-esophageal reflux disease with esophagitis, without bleeding: Secondary | ICD-10-CM | POA: Diagnosis not present

## 2021-07-07 DIAGNOSIS — Z6827 Body mass index (BMI) 27.0-27.9, adult: Secondary | ICD-10-CM | POA: Diagnosis not present

## 2021-07-07 DIAGNOSIS — E7849 Other hyperlipidemia: Secondary | ICD-10-CM | POA: Diagnosis not present

## 2021-07-07 DIAGNOSIS — Z1212 Encounter for screening for malignant neoplasm of rectum: Secondary | ICD-10-CM | POA: Diagnosis not present

## 2021-07-07 DIAGNOSIS — H9313 Tinnitus, bilateral: Secondary | ICD-10-CM | POA: Diagnosis not present

## 2021-07-07 DIAGNOSIS — G20 Parkinson's disease: Secondary | ICD-10-CM | POA: Diagnosis not present

## 2021-10-31 DIAGNOSIS — N183 Chronic kidney disease, stage 3 unspecified: Secondary | ICD-10-CM | POA: Diagnosis not present

## 2021-10-31 DIAGNOSIS — E782 Mixed hyperlipidemia: Secondary | ICD-10-CM | POA: Diagnosis not present

## 2021-10-31 DIAGNOSIS — E7849 Other hyperlipidemia: Secondary | ICD-10-CM | POA: Diagnosis not present

## 2021-10-31 DIAGNOSIS — K2100 Gastro-esophageal reflux disease with esophagitis, without bleeding: Secondary | ICD-10-CM | POA: Diagnosis not present

## 2021-11-03 DIAGNOSIS — E7849 Other hyperlipidemia: Secondary | ICD-10-CM | POA: Diagnosis not present

## 2021-11-03 DIAGNOSIS — K2100 Gastro-esophageal reflux disease with esophagitis, without bleeding: Secondary | ICD-10-CM | POA: Diagnosis not present

## 2021-11-03 DIAGNOSIS — H9313 Tinnitus, bilateral: Secondary | ICD-10-CM | POA: Diagnosis not present

## 2021-11-03 DIAGNOSIS — Z6826 Body mass index (BMI) 26.0-26.9, adult: Secondary | ICD-10-CM | POA: Diagnosis not present

## 2021-11-03 DIAGNOSIS — G20 Parkinson's disease: Secondary | ICD-10-CM | POA: Diagnosis not present

## 2021-11-22 DIAGNOSIS — J069 Acute upper respiratory infection, unspecified: Secondary | ICD-10-CM | POA: Diagnosis not present

## 2021-11-22 DIAGNOSIS — Z6826 Body mass index (BMI) 26.0-26.9, adult: Secondary | ICD-10-CM | POA: Diagnosis not present

## 2021-11-22 DIAGNOSIS — J019 Acute sinusitis, unspecified: Secondary | ICD-10-CM | POA: Diagnosis not present

## 2022-02-21 DIAGNOSIS — Z23 Encounter for immunization: Secondary | ICD-10-CM | POA: Diagnosis not present

## 2022-02-27 DIAGNOSIS — H9313 Tinnitus, bilateral: Secondary | ICD-10-CM | POA: Diagnosis not present

## 2022-02-27 DIAGNOSIS — E7849 Other hyperlipidemia: Secondary | ICD-10-CM | POA: Diagnosis not present

## 2022-02-27 DIAGNOSIS — Z6827 Body mass index (BMI) 27.0-27.9, adult: Secondary | ICD-10-CM | POA: Diagnosis not present

## 2022-02-27 DIAGNOSIS — K2100 Gastro-esophageal reflux disease with esophagitis, without bleeding: Secondary | ICD-10-CM | POA: Diagnosis not present

## 2022-03-08 DIAGNOSIS — M545 Low back pain, unspecified: Secondary | ICD-10-CM | POA: Diagnosis not present

## 2022-03-08 DIAGNOSIS — M79651 Pain in right thigh: Secondary | ICD-10-CM | POA: Diagnosis not present

## 2022-04-10 DIAGNOSIS — M79651 Pain in right thigh: Secondary | ICD-10-CM | POA: Diagnosis not present

## 2022-04-10 DIAGNOSIS — M545 Low back pain, unspecified: Secondary | ICD-10-CM | POA: Diagnosis not present

## 2022-04-14 DIAGNOSIS — M545 Low back pain, unspecified: Secondary | ICD-10-CM | POA: Diagnosis not present

## 2022-04-17 DIAGNOSIS — M545 Low back pain, unspecified: Secondary | ICD-10-CM | POA: Diagnosis not present

## 2022-07-12 DIAGNOSIS — E7849 Other hyperlipidemia: Secondary | ICD-10-CM | POA: Diagnosis not present

## 2022-07-12 DIAGNOSIS — R030 Elevated blood-pressure reading, without diagnosis of hypertension: Secondary | ICD-10-CM | POA: Diagnosis not present

## 2022-07-12 DIAGNOSIS — Z0001 Encounter for general adult medical examination with abnormal findings: Secondary | ICD-10-CM | POA: Diagnosis not present

## 2022-07-12 DIAGNOSIS — N183 Chronic kidney disease, stage 3 unspecified: Secondary | ICD-10-CM | POA: Diagnosis not present

## 2022-07-12 DIAGNOSIS — G4733 Obstructive sleep apnea (adult) (pediatric): Secondary | ICD-10-CM | POA: Diagnosis not present

## 2022-10-06 DIAGNOSIS — S61412A Laceration without foreign body of left hand, initial encounter: Secondary | ICD-10-CM | POA: Diagnosis not present

## 2022-10-06 DIAGNOSIS — W260XXA Contact with knife, initial encounter: Secondary | ICD-10-CM | POA: Diagnosis not present

## 2022-10-06 DIAGNOSIS — G20A1 Parkinson's disease without dyskinesia, without mention of fluctuations: Secondary | ICD-10-CM | POA: Diagnosis not present

## 2022-11-27 DIAGNOSIS — K2100 Gastro-esophageal reflux disease with esophagitis, without bleeding: Secondary | ICD-10-CM | POA: Diagnosis not present

## 2022-11-27 DIAGNOSIS — E785 Hyperlipidemia, unspecified: Secondary | ICD-10-CM | POA: Diagnosis not present

## 2022-11-27 DIAGNOSIS — E039 Hypothyroidism, unspecified: Secondary | ICD-10-CM | POA: Diagnosis not present

## 2022-11-27 DIAGNOSIS — Z1329 Encounter for screening for other suspected endocrine disorder: Secondary | ICD-10-CM | POA: Diagnosis not present

## 2022-11-27 DIAGNOSIS — N183 Chronic kidney disease, stage 3 unspecified: Secondary | ICD-10-CM | POA: Diagnosis not present

## 2022-11-27 DIAGNOSIS — Z0001 Encounter for general adult medical examination with abnormal findings: Secondary | ICD-10-CM | POA: Diagnosis not present

## 2022-11-27 DIAGNOSIS — E7849 Other hyperlipidemia: Secondary | ICD-10-CM | POA: Diagnosis not present

## 2022-11-27 DIAGNOSIS — R351 Nocturia: Secondary | ICD-10-CM | POA: Diagnosis not present

## 2022-11-27 DIAGNOSIS — Z125 Encounter for screening for malignant neoplasm of prostate: Secondary | ICD-10-CM | POA: Diagnosis not present

## 2022-12-01 DIAGNOSIS — Z6826 Body mass index (BMI) 26.0-26.9, adult: Secondary | ICD-10-CM | POA: Diagnosis not present

## 2022-12-01 DIAGNOSIS — E7849 Other hyperlipidemia: Secondary | ICD-10-CM | POA: Diagnosis not present

## 2022-12-01 DIAGNOSIS — Z23 Encounter for immunization: Secondary | ICD-10-CM | POA: Diagnosis not present

## 2022-12-01 DIAGNOSIS — G20A1 Parkinson's disease without dyskinesia, without mention of fluctuations: Secondary | ICD-10-CM | POA: Diagnosis not present

## 2022-12-01 DIAGNOSIS — H9313 Tinnitus, bilateral: Secondary | ICD-10-CM | POA: Diagnosis not present

## 2022-12-01 DIAGNOSIS — K2100 Gastro-esophageal reflux disease with esophagitis, without bleeding: Secondary | ICD-10-CM | POA: Diagnosis not present

## 2022-12-01 DIAGNOSIS — R030 Elevated blood-pressure reading, without diagnosis of hypertension: Secondary | ICD-10-CM | POA: Diagnosis not present

## 2022-12-28 DIAGNOSIS — Z6826 Body mass index (BMI) 26.0-26.9, adult: Secondary | ICD-10-CM | POA: Diagnosis not present

## 2022-12-28 DIAGNOSIS — G20A1 Parkinson's disease without dyskinesia, without mention of fluctuations: Secondary | ICD-10-CM | POA: Diagnosis not present

## 2022-12-28 DIAGNOSIS — R030 Elevated blood-pressure reading, without diagnosis of hypertension: Secondary | ICD-10-CM | POA: Diagnosis not present

## 2022-12-28 DIAGNOSIS — Z20828 Contact with and (suspected) exposure to other viral communicable diseases: Secondary | ICD-10-CM | POA: Diagnosis not present

## 2023-04-16 DIAGNOSIS — R059 Cough, unspecified: Secondary | ICD-10-CM | POA: Diagnosis not present

## 2023-04-16 DIAGNOSIS — J329 Chronic sinusitis, unspecified: Secondary | ICD-10-CM | POA: Diagnosis not present

## 2023-04-16 DIAGNOSIS — Z20828 Contact with and (suspected) exposure to other viral communicable diseases: Secondary | ICD-10-CM | POA: Diagnosis not present

## 2023-04-16 DIAGNOSIS — Z6827 Body mass index (BMI) 27.0-27.9, adult: Secondary | ICD-10-CM | POA: Diagnosis not present

## 2023-06-04 DIAGNOSIS — G20A1 Parkinson's disease without dyskinesia, without mention of fluctuations: Secondary | ICD-10-CM | POA: Diagnosis not present

## 2023-06-04 DIAGNOSIS — E7849 Other hyperlipidemia: Secondary | ICD-10-CM | POA: Diagnosis not present

## 2023-06-04 DIAGNOSIS — N183 Chronic kidney disease, stage 3 unspecified: Secondary | ICD-10-CM | POA: Diagnosis not present

## 2023-06-04 DIAGNOSIS — Z1321 Encounter for screening for nutritional disorder: Secondary | ICD-10-CM | POA: Diagnosis not present

## 2023-06-20 DIAGNOSIS — G20A1 Parkinson's disease without dyskinesia, without mention of fluctuations: Secondary | ICD-10-CM | POA: Diagnosis not present

## 2023-06-22 DIAGNOSIS — G20A1 Parkinson's disease without dyskinesia, without mention of fluctuations: Secondary | ICD-10-CM | POA: Diagnosis not present

## 2023-06-26 DIAGNOSIS — G20A1 Parkinson's disease without dyskinesia, without mention of fluctuations: Secondary | ICD-10-CM | POA: Diagnosis not present

## 2023-06-29 DIAGNOSIS — G20A1 Parkinson's disease without dyskinesia, without mention of fluctuations: Secondary | ICD-10-CM | POA: Diagnosis not present

## 2023-07-03 DIAGNOSIS — G20A1 Parkinson's disease without dyskinesia, without mention of fluctuations: Secondary | ICD-10-CM | POA: Diagnosis not present

## 2023-07-06 DIAGNOSIS — G20A1 Parkinson's disease without dyskinesia, without mention of fluctuations: Secondary | ICD-10-CM | POA: Diagnosis not present

## 2023-07-09 DIAGNOSIS — G20A1 Parkinson's disease without dyskinesia, without mention of fluctuations: Secondary | ICD-10-CM | POA: Diagnosis not present

## 2023-07-13 DIAGNOSIS — G20A1 Parkinson's disease without dyskinesia, without mention of fluctuations: Secondary | ICD-10-CM | POA: Diagnosis not present

## 2023-07-16 DIAGNOSIS — G20A1 Parkinson's disease without dyskinesia, without mention of fluctuations: Secondary | ICD-10-CM | POA: Diagnosis not present

## 2023-07-24 DIAGNOSIS — G20A1 Parkinson's disease without dyskinesia, without mention of fluctuations: Secondary | ICD-10-CM | POA: Diagnosis not present

## 2023-07-27 DIAGNOSIS — G20A1 Parkinson's disease without dyskinesia, without mention of fluctuations: Secondary | ICD-10-CM | POA: Diagnosis not present

## 2023-07-30 DIAGNOSIS — G20A1 Parkinson's disease without dyskinesia, without mention of fluctuations: Secondary | ICD-10-CM | POA: Diagnosis not present

## 2023-08-03 DIAGNOSIS — G20A1 Parkinson's disease without dyskinesia, without mention of fluctuations: Secondary | ICD-10-CM | POA: Diagnosis not present

## 2023-08-06 DIAGNOSIS — G20A1 Parkinson's disease without dyskinesia, without mention of fluctuations: Secondary | ICD-10-CM | POA: Diagnosis not present

## 2023-08-09 DIAGNOSIS — G20A1 Parkinson's disease without dyskinesia, without mention of fluctuations: Secondary | ICD-10-CM | POA: Diagnosis not present

## 2023-08-13 DIAGNOSIS — G20A1 Parkinson's disease without dyskinesia, without mention of fluctuations: Secondary | ICD-10-CM | POA: Diagnosis not present

## 2023-08-20 DIAGNOSIS — G20A1 Parkinson's disease without dyskinesia, without mention of fluctuations: Secondary | ICD-10-CM | POA: Diagnosis not present

## 2023-08-22 DIAGNOSIS — G20A1 Parkinson's disease without dyskinesia, without mention of fluctuations: Secondary | ICD-10-CM | POA: Diagnosis not present

## 2023-08-28 DIAGNOSIS — G20A1 Parkinson's disease without dyskinesia, without mention of fluctuations: Secondary | ICD-10-CM | POA: Diagnosis not present

## 2023-08-30 DIAGNOSIS — G20A1 Parkinson's disease without dyskinesia, without mention of fluctuations: Secondary | ICD-10-CM | POA: Diagnosis not present

## 2023-09-04 DIAGNOSIS — G20A1 Parkinson's disease without dyskinesia, without mention of fluctuations: Secondary | ICD-10-CM | POA: Diagnosis not present

## 2023-09-06 DIAGNOSIS — G20A1 Parkinson's disease without dyskinesia, without mention of fluctuations: Secondary | ICD-10-CM | POA: Diagnosis not present

## 2023-09-11 DIAGNOSIS — G20A1 Parkinson's disease without dyskinesia, without mention of fluctuations: Secondary | ICD-10-CM | POA: Diagnosis not present

## 2023-09-19 DIAGNOSIS — M542 Cervicalgia: Secondary | ICD-10-CM | POA: Diagnosis not present

## 2023-09-19 DIAGNOSIS — M25512 Pain in left shoulder: Secondary | ICD-10-CM | POA: Diagnosis not present

## 2023-10-04 DIAGNOSIS — M5031 Other cervical disc degeneration,  high cervical region: Secondary | ICD-10-CM | POA: Diagnosis not present

## 2023-10-04 DIAGNOSIS — M5030 Other cervical disc degeneration, unspecified cervical region: Secondary | ICD-10-CM | POA: Diagnosis not present

## 2023-10-04 DIAGNOSIS — M25412 Effusion, left shoulder: Secondary | ICD-10-CM | POA: Diagnosis not present

## 2023-10-04 DIAGNOSIS — M50321 Other cervical disc degeneration at C4-C5 level: Secondary | ICD-10-CM | POA: Diagnosis not present

## 2023-10-04 DIAGNOSIS — M65912 Unspecified synovitis and tenosynovitis, left shoulder: Secondary | ICD-10-CM | POA: Diagnosis not present

## 2023-10-04 DIAGNOSIS — M5412 Radiculopathy, cervical region: Secondary | ICD-10-CM | POA: Diagnosis not present

## 2023-10-04 DIAGNOSIS — M7582 Other shoulder lesions, left shoulder: Secondary | ICD-10-CM | POA: Diagnosis not present

## 2023-10-04 DIAGNOSIS — M4802 Spinal stenosis, cervical region: Secondary | ICD-10-CM | POA: Diagnosis not present

## 2023-10-08 DIAGNOSIS — S46012A Strain of muscle(s) and tendon(s) of the rotator cuff of left shoulder, initial encounter: Secondary | ICD-10-CM | POA: Diagnosis not present

## 2023-10-15 DIAGNOSIS — M19012 Primary osteoarthritis, left shoulder: Secondary | ICD-10-CM | POA: Diagnosis not present

## 2023-10-15 DIAGNOSIS — G8929 Other chronic pain: Secondary | ICD-10-CM | POA: Diagnosis not present

## 2023-10-15 DIAGNOSIS — M25412 Effusion, left shoulder: Secondary | ICD-10-CM | POA: Diagnosis not present

## 2023-10-23 NOTE — Progress Notes (Signed)
 Surgery orders requested via Epic inbox.

## 2023-10-24 ENCOUNTER — Encounter (HOSPITAL_COMMUNITY): Payer: Self-pay

## 2023-10-24 ENCOUNTER — Encounter (HOSPITAL_COMMUNITY)
Admission: RE | Admit: 2023-10-24 | Discharge: 2023-10-24 | Disposition: A | Discharge status: Home / Self Care | Source: Ambulatory Visit | Attending: Orthopedic Surgery | Admitting: Orthopedic Surgery

## 2023-10-24 ENCOUNTER — Other Ambulatory Visit: Payer: Self-pay

## 2023-10-24 DIAGNOSIS — Z01810 Encounter for preprocedural cardiovascular examination: Secondary | ICD-10-CM | POA: Insufficient documentation

## 2023-10-24 DIAGNOSIS — M25512 Pain in left shoulder: Secondary | ICD-10-CM | POA: Diagnosis not present

## 2023-10-24 DIAGNOSIS — Z01818 Encounter for other preprocedural examination: Secondary | ICD-10-CM | POA: Diagnosis present

## 2023-10-24 LAB — CBC
HCT: 42.8 % (ref 39.0–52.0)
Hemoglobin: 13.5 g/dL (ref 13.0–17.0)
MCH: 30.3 pg (ref 26.0–34.0)
MCHC: 31.5 g/dL (ref 30.0–36.0)
MCV: 96 fL (ref 80.0–100.0)
Platelets: 292 10*3/uL (ref 150–400)
RBC: 4.46 MIL/uL (ref 4.22–5.81)
RDW: 13.5 % (ref 11.5–15.5)
WBC: 6.5 10*3/uL (ref 4.0–10.5)
nRBC: 0 % (ref 0.0–0.2)

## 2023-10-24 LAB — BASIC METABOLIC PANEL WITH GFR
Anion gap: 7 (ref 5–15)
BUN: 15 mg/dL (ref 8–23)
CO2: 27 mmol/L (ref 22–32)
Calcium: 9.4 mg/dL (ref 8.9–10.3)
Chloride: 107 mmol/L (ref 98–111)
Creatinine, Ser: 0.81 mg/dL (ref 0.61–1.24)
GFR, Estimated: >60 mL/min (ref >60)
Glucose, Bld: 95 mg/dL (ref 70–99)
Potassium: 4.3 mmol/L (ref 3.5–5.1)
Sodium: 141 mmol/L (ref 135–145)

## 2023-10-24 LAB — SURGICAL PCR SCREEN
MRSA, PCR: NEGATIVE
Staphylococcus aureus: NEGATIVE

## 2023-10-24 NOTE — Progress Notes (Addendum)
 For Anesthesia: PCP - Leesa Pulling, MD  Cardiologist - N/A  Bowel Prep reminder:  Chest x-ray -  EKG -  Stress Test -  ECHO - 04/21/15 Cardiac Cath -  Pacemaker/ICD device last checked: Pacemaker orders received: Device Rep notified:  Spinal Cord Stimulator:N/A  Sleep Study - Yes CPAP - NO  Fasting Blood Sugar - N/A Checks Blood Sugar _____ times a day Date and result of last Hgb A1c-  Last dose of GLP1 agonist- N/A GLP1 instructions:   Last dose of SGLT-2 inhibitors- N/A SGLT-2 instructions:   Blood Thinner Instructions:N/A Aspirin  Instructions: Last Dose:  Activity level: Can go up a flight of stairs and activities of daily living without stopping and without chest pain and/or shortness of breath   Able to exercise without chest pain and/or shortness of breath  Anesthesia review: Hx: OSA(NO CPAP)  Patient denies shortness of breath, fever, cough and chest pain at PAT appointment   Patient verbalized understanding of instructions that were given to them at the PAT appointment. Patient was also instructed that they will need to review over the PAT instructions again at home before surgery.

## 2023-10-24 NOTE — Patient Instructions (Addendum)
 SURGICAL WAITING ROOM VISITATION  Patients having surgery or a procedure may have no more than 2 support people in the waiting area - these visitors may rotate.    Children under the age of 68 must have an adult with them who is not the patient.  Due to an increase in RSV and influenza rates and associated hospitalizations, children ages 70 and under may not visit patients in [facility] hospitals.  Visitors with respiratory illnesses are discouraged from visiting and should remain at home.  If the patient needs to stay at the hospital during part of their recovery, the visitor guidelines for inpatient rooms apply. Pre-op nurse will coordinate an appropriate time for 1 support person to accompany patient in pre-op.  This support person may not rotate.    Please refer to the [facility] website for the visitor guidelines for Inpatients (after your surgery is over and you are in a regular room).       Your procedure is scheduled on: 10/30/23   Report to [facility] Main Entrance    Report to admitting at : 8:00 AM   Call this number if you have problems the morning of surgery [phone]   Do not eat food :After Midnight.   After Midnight you may have the following liquids until : 7:30 AM DAY OF SURGERY  Water  Non-Citrus Juices (without pulp, NO RED-Apple, White grape, White cranberry) Black Coffee (NO MILK/CREAM OR CREAMERS, sugar ok)  Clear Tea (NO MILK/CREAM OR CREAMERS, sugar ok) regular and decaf                             Plain Jell-O (NO RED)                                           Fruit ices (not with fruit pulp, NO RED)                                     Popsicles (NO RED)                                                               Sports drinks like Gatorade (NO RED)   The day of surgery:  Drink ONE (1) Pre-Surgery Clear Ensure at : 7:30 AM the morning of surgery. Drink in one sitting. Do not sip.  This drink was given to you during your hospital  pre-op  appointment visit. Nothing else to drink after completing the  Pre-Surgery Clear Ensure or G2.          If you have questions, please contact your surgeon's office.  FOLLOW ANY ADDITIONAL PRE OP INSTRUCTIONS YOU RECEIVED FROM YOUR SURGEON'S OFFICE!!!   Oral Hygiene is also important to reduce your risk of infection.                                    Remember - BRUSH YOUR TEETH THE MORNING OF SURGERY WITH YOUR REGULAR TOOTHPASTE  DENTURES WILL BE REMOVED PRIOR TO SURGERY PLEASE DO NOT APPLY "Poly grip" OR ADHESIVES!!!   Do NOT smoke after Midnight   Stop all vitamins and herbal supplements 7 days before surgery.   Take these medicines the morning of surgery with A SIP OF WATER : carbidopa ,ropinirole ,famotidine ,pantoprazole .                              You may not have any metal on your body including hair pins, jewelry, and body piercing             Do not wear lotions, powders, perfumes/cologne, or deodorant              Men may shave face and neck.   Do not bring valuables to the hospital. [facility] IS NOT             RESPONSIBLE   FOR VALUABLES.   Contacts, glasses, dentures or bridgework may not be worn into surgery.   Bring small overnight bag day of surgery.   DO NOT BRING YOUR HOME MEDICATIONS TO THE HOSPITAL. PHARMACY WILL DISPENSE MEDICATIONS LISTED ON YOUR MEDICATION LIST TO YOU DURING YOUR ADMISSION IN THE HOSPITAL!    Patients discharged on the day of surgery will not be allowed to drive home.  Someone NEEDS to stay with you for the first 24 hours after anesthesia.   Special Instructions: Bring a copy of your healthcare power of attorney and living will documents the day of surgery if you haven't scanned them before.              Please read over the following fact sheets you were given: IF YOU HAVE QUESTIONS ABOUT YOUR PRE-OP INSTRUCTIONS PLEASE CALL [phone]   If you received a COVID test during your pre-op visit  it is requested that you wear a mask when  out in public, stay away from anyone that may not be feeling well and notify your surgeon if you develop symptoms. If you test positive for Covid or have been in contact with anyone that has tested positive in the last 10 days please notify you surgeon. [facility]- Preparing for Total Shoulder Arthroplasty    Before surgery, you can play an important role. Because skin is not sterile, your skin needs to be as free of germs as possible. You can reduce the number of germs on your skin by using the following products. Benzoyl Peroxide Gel Reduces the number of germs present on the skin Applied twice a day to shoulder area starting two days before surgery    ==================================================================  Please follow these instructions carefully:  BENZOYL PEROXIDE 5% GEL  Please do not use if you have an allergy to benzoyl peroxide.   If your skin becomes reddened/irritated stop using the benzoyl peroxide.  Starting two days before surgery, apply as follows: Apply benzoyl peroxide in the morning and at night. Apply after taking a shower. If you are not taking a shower clean entire shoulder front, back, and side along with the armpit with a clean wet washcloth.  Place a quarter-sized dollop on your shoulder and rub in thoroughly, making sure to cover the front, back, and side of your shoulder, along with the armpit.   2 days before ____ AM   ____ PM              1 day before ____ AM   ____ PM  Do this twice a day for two days.  (Last application is the night before surgery, AFTER using the CHG soap as described below).  Do NOT apply benzoyl peroxide gel on the day of surgery.     Pre-operative 5 CHG Bath Instructions   You can play a key role in reducing the risk of infection after surgery. Your skin needs to be as free of germs as possible. You can reduce the number of germs on your skin by washing with CHG (chlorhexidine  gluconate) soap before  surgery. CHG is an antiseptic soap that kills germs and continues to kill germs even after washing.   DO NOT use if you have an allergy to chlorhexidine /CHG or antibacterial soaps. If your skin becomes reddened or irritated, stop using the CHG and notify one of our RNs at [phone].   Please shower with the CHG soap starting 4 days before surgery using the following schedule:     Please keep in mind the following:  DO NOT shave, including legs and underarms, starting the day of your first shower.   You may shave your face at any point before/day of surgery.  Place clean sheets on your bed the day you start using CHG soap. Use a clean washcloth (not used since being washed) for each shower. DO NOT sleep with pets once you start using the CHG.   CHG Shower Instructions:  If you choose to wash your hair and private area, wash first with your normal shampoo/soap.  After you use shampoo/soap, rinse your hair and body thoroughly to remove shampoo/soap residue.  Turn the water  OFF and apply about 3 tablespoons (45 ml) of CHG soap to a CLEAN washcloth.  Apply CHG soap ONLY FROM YOUR NECK DOWN TO YOUR TOES (washing for 3-5 minutes)  DO NOT use CHG soap on face, private areas, open wounds, or sores.  Pay special attention to the area where your surgery is being performed.  If you are having back surgery, having someone wash your back for you may be helpful. Wait 2 minutes after CHG soap is applied, then you may rinse off the CHG soap.  Pat dry with a clean towel  Put on clean clothes/pajamas   If you choose to wear lotion, please use ONLY the CHG-compatible lotions on the back of this paper.     Additional instructions for the day of surgery: DO NOT APPLY any lotions, deodorants, cologne, or perfumes.   Put on clean/comfortable clothes.  Brush your teeth.  Ask your nurse before applying any prescription medications to the skin.   CHG Compatible Lotions   Aveeno Moisturizing lotion   Cetaphil Moisturizing Cream  Cetaphil Moisturizing Lotion  Clairol Herbal Essence Moisturizing Lotion, Dry Skin  Clairol Herbal Essence Moisturizing Lotion, Extra Dry Skin  Clairol Herbal Essence Moisturizing Lotion, Normal Skin  Curel Age Defying Therapeutic Moisturizing Lotion with Alpha Hydroxy  Curel Extreme Care Body Lotion  Curel Soothing Hands Moisturizing Hand Lotion  Curel Therapeutic Moisturizing Cream, Fragrance-Free  Curel Therapeutic Moisturizing Lotion, Fragrance-Free  Curel Therapeutic Moisturizing Lotion, Original Formula  Eucerin Daily Replenishing Lotion  Eucerin Dry Skin Therapy Plus Alpha Hydroxy Crme  Eucerin Dry Skin Therapy Plus Alpha Hydroxy Lotion  Eucerin Original Crme  Eucerin Original Lotion  Eucerin Plus Crme Eucerin Plus Lotion  Eucerin TriLipid Replenishing Lotion  Keri Anti-Bacterial Hand Lotion  Keri Deep Conditioning Original Lotion Dry Skin Formula Softly Scented  Keri Deep Conditioning Original Lotion, Fragrance Free Sensitive Skin Formula  Keri Lotion Fast  Absorbing Fragrance Free Sensitive Skin Formula  Keri Lotion Fast Absorbing Softly Scented Dry Skin Formula  Keri Original Lotion  Keri Skin Renewal Lotion Keri Silky Smooth Lotion  Keri Silky Smooth Sensitive Skin Lotion  Nivea Body Creamy Conditioning Oil  Nivea Body Extra Enriched Lotion  Nivea Body Original Lotion  Nivea Body Sheer Moisturizing Lotion Nivea Crme  Nivea Skin Firming Lotion  NutraDerm 30 Skin Lotion  NutraDerm Skin Lotion  NutraDerm Therapeutic Skin Cream  NutraDerm Therapeutic Skin Lotion  ProShield Protective Hand Cream  Provon moisturizing lotion   Incentive Spirometer  An incentive spirometer is a tool that can help keep your lungs clear and active. This tool measures how well you are filling your lungs with each breath. Taking long deep breaths may help reverse or decrease the chance of developing breathing (pulmonary) problems (especially infection)  following: A long period of time when you are unable to move or be active. BEFORE THE PROCEDURE  If the spirometer includes an indicator to show your best effort, your nurse or respiratory therapist will set it to a desired goal. If possible, sit up straight or lean slightly forward. Try not to slouch. Hold the incentive spirometer in an upright position. INSTRUCTIONS FOR USE  Sit on the edge of your bed if possible, or sit up as far as you can in bed or on a chair. Hold the incentive spirometer in an upright position. Breathe out normally. Place the mouthpiece in your mouth and seal your lips tightly around it. Breathe in slowly and as deeply as possible, raising the piston or the ball toward the top of the column. Hold your breath for 3-5 seconds or for as long as possible. Allow the piston or ball to fall to the bottom of the column. Remove the mouthpiece from your mouth and breathe out normally. Rest for a few seconds and repeat Steps 1 through 7 at least 10 times every 1-2 hours when you are awake. Take your time and take a few normal breaths between deep breaths. The spirometer may include an indicator to show your best effort. Use the indicator as a goal to work toward during each repetition. After each set of 10 deep breaths, practice coughing to be sure your lungs are clear. If you have an incision (the cut made at the time of surgery), support your incision when coughing by placing a pillow or rolled up towels firmly against it. Once you are able to get out of bed, walk around indoors and cough well. You may stop using the incentive spirometer when instructed by your caregiver.  RISKS AND COMPLICATIONS Take your time so you do not get dizzy or light-headed. If you are in pain, you may need to take or ask for pain medication before doing incentive spirometry. It is harder to take a deep breath if you are having pain. AFTER USE Rest and breathe slowly and easily. It can be helpful to  keep track of a log of your progress. Your caregiver can provide you with a simple table to help with this. If you are using the spirometer at home, follow these instructions: SEEK MEDICAL CARE IF:  You are having difficultly using the spirometer. You have trouble using the spirometer as often as instructed. Your pain medication is not giving enough relief while using the spirometer. You develop fever of 100.5 F (38.1 C) or higher. SEEK IMMEDIATE MEDICAL CARE IF:  You cough up bloody sputum that had not been present before. You develop  fever of 102 F (38.9 C) or greater. You develop worsening pain at or near the incision site. MAKE SURE YOU:  Understand these instructions. Will watch your condition. Will get help right away if you are not doing well or get worse. Document Released: 09/25/2006 Document Revised: 08/07/2011 Document Reviewed: 11/26/2006 [facility] Patient Information 2014 [place], [place].   ________________________________________________________________________

## 2023-10-25 ENCOUNTER — Encounter (HOSPITAL_COMMUNITY)
Admission: RE | Admit: 2023-10-25 | Discharge: 2023-10-25 | Disposition: A | Discharge status: Home / Self Care | Source: Ambulatory Visit | Attending: Orthopedic Surgery | Admitting: Orthopedic Surgery

## 2023-10-25 VITALS — BP 128/70 | HR 63 | Temp 97.6°F | Ht 73.5 in | Wt 182.0 lb

## 2023-10-25 DIAGNOSIS — Z01818 Encounter for other preprocedural examination: Secondary | ICD-10-CM

## 2023-10-29 NOTE — H&P (Signed)
 SHOULDER ARTHROPLASTY ADMISSION H&P  Patient ID: Larry Miller MRN: [id number] DOB/AGE: [birth year] 73 y.o.  Chief Complaint: left shoulder pain.  Planned Procedure Date: 10/30/23  Medical Clearance by Dr. Bearl Limes     HPI: Larry Miller is a 73 y.o. male who presents for evaluation of rotator cuff arthropathy of left shoulder. He has a history of a left shoulder arthroscopy with biceps tenodesis and subscapularis repair in 2020. He did well with this but over the course of the last couple of months he has had progressive near complete loss of function. Pain is moderate and has has significant decrease in ROM. He doesn't remember a specific injury. MRI of his left shoulder demonstrates a massive cuff tear with retraction involving both supraspinatus and infraspinatus with atrophy. There is no active infection.  Past Medical History:  Diagnosis Date   Arthritis    GERD (gastroesophageal reflux disease)    History of kidney stones    Hypercholesterolemia    Paralysis agitans (HCC)    Parkinson disease (HCC)    Sleep apnea    Past Surgical History:  Procedure Laterality Date   COLONOSCOPY     ROTATOR CUFF REPAIR Left 2020   TOTAL HIP ARTHROPLASTY Right 07/20/2020   Procedure: TOTAL HIP ARTHROPLASTY;  Surgeon: Osa Blase, MD;  Location: [facility];  Service: Orthopedics;  Laterality: Right;   No Known Allergies Prior to Admission medications   Medication Sig Start Date End Date Taking? Authorizing Provider  carbidopa -levodopa  (SINEMET  IR) 25-250 MG tablet Take 2 tablets by mouth 4 (four) times daily. 08/04/23  Yes [provider]  famotidine  (PEPCID ) 40 MG tablet Take 40 mg by mouth 2 (two) times daily. 06/15/20  Yes [provider]  Misc Natural Products (PROSTATE SUPPORT PO) Take 1 tablet by mouth daily.   Yes [provider]  pantoprazole  (PROTONIX ) 40 MG tablet Take 40 mg by mouth daily. 06/19/20  Yes [provider]  penicillin v potassium (VEETID) 500  MG tablet Take 500 mg by mouth 4 (four) times daily. 10/17/23  Yes [provider]  rOPINIRole  (REQUIP ) 4 MG tablet Take 4 mg by mouth 3 (three) times daily. 04/07/20  Yes [provider]  aspirin  EC 325 MG tablet Take 1 tablet (325 mg total) by mouth 2 (two) times daily. Patient not taking: Reported on 10/19/2023 07/21/20   Serjio Deupree K, PA-C  ondansetron  (ZOFRAN ) 4 MG tablet Take 1 tablet (4 mg total) by mouth every 8 (eight) hours as needed for nausea or vomiting. Patient not taking: Reported on 10/19/2023 07/21/20   Kalianna Verbeke K, PA-C  oxyCODONE  (ROXICODONE ) 5 MG immediate release tablet Take 1 tablet (5 mg total) by mouth every 4 (four) hours as needed for severe pain. Patient not taking: Reported on 10/19/2023 07/21/20   Weston Fulco K, PA-C  sennosides-docusate sodium  (SENOKOT-S) 8.6-50 MG tablet Take 2 tablets by mouth daily. Patient not taking: Reported on 10/19/2023 07/21/20   Abraham Hoffmann, PA-C   Social History   Socioeconomic History   Marital status: Married    Spouse name: Not on file   Number of children: Not on file   Years of education: Not on file   Highest education level: Not on file  Occupational History   Not on file  Tobacco Use   Smoking status: Former   Smokeless tobacco: Never   Tobacco comments:    Quit 30 years ago  Vaping Use   Vaping status: Never Used  Substance and Sexual  Activity   Alcohol  use: Never   Drug use: Never   Sexual activity: Not on file  Other Topics Concern   Not on file  Social History Narrative   Not on file   Social Drivers of Health   Financial Resource Strain: Not on file  Food Insecurity: Not on file  Transportation Needs: Not on file  Physical Activity: Not on file  Stress: Not on file  Social Connections: Not on file   No family history on file.  ROS: Currently denies lightheadedness, dizziness, Fever, chills, CP, SOB.   No personal history of DVT, PE, MI, or CVA. No loose teeth or dentures All  other systems have been reviewed and were otherwise currently negative with the exception of those mentioned in the HPI and as above.  BMI: Estimated body mass index is 23.69 kg/m as calculated from the following:   Height as of 10/24/23: 6' 1.5" (1.867 m).   Weight as of 10/24/23: 82.6 kg.  No results found for: "ALBUMIN " Diabetes: Patient does not have a diagnosis of diabetes.     Smoking Status:       Objective: Vitals: Ht: 6\' 1"  Wt: 183.6 lbs BP: 100/63 Pulse: 59  Physical Exam: General: Alert, NAD.   HEENT: EOMI, Good Neck Extension  Pulm: No increased work of breathing.  Clear B/L A/P w/o crackle or wheeze.  CV: RRR, No m/g/r appreciated  GI: soft, NT, ND Neuro: Neuro without gross focal deficit.  Sensation intact distally Skin: No lesions in the area of chief complaint MSK/Surgical Site: left shoulder pain with range of motion.  Forward flexion/abduction approximately 0-30 deg.  Internal rotation to L1.  External rotation to 60.  Mild popeye    NVI distally.  Imaging Review MRI of his left shoulder demonstrates a massive cuff tear with retraction involving both supraspinatus and infraspinatus with atrophy.   Assessment: osteoarthritis of left shoulder  Plan: Plan for Procedure(s): ARTHROPLASTY, SHOULDER, TOTAL, REVERSE  The patient history, physical exam, clinical judgement of the provider and imaging are consistent with end stage degenerative joint disease and reverse total joint arthroplasty is deemed medically necessary. The treatment options including medical management, injection therapy, and arthroplasty were discussed at length. The risks and benefits of Procedure(s): ARTHROPLASTY, SHOULDER, TOTAL, REVERSE were presented and reviewed.  The risks of nonoperative treatment, versus surgical intervention including but not limited to continued pain, aseptic loosening, stiffness, dislocation/subluxation, infection, bleeding, nerve injury, blood clots, cardiopulmonary  complications, morbidity, mortality, among others were discussed. The patient verbalizes understanding and wishes to proceed with the plan.  Patient is being admitted for surgery, OT, pain control, prophylactic antibiotics, VTE prophylaxis, progressive ambulation, ADL's and discharge planning.   The patient does meet the criteria for TXA which will be used perioperatively.   The patient is planning to be discharged home in care of wife.   Abraham Hoffmann, PA-C 10/29/2023 12:44 PM

## 2023-10-30 ENCOUNTER — Ambulatory Visit (HOSPITAL_COMMUNITY): Admitting: Anesthesiology

## 2023-10-30 ENCOUNTER — Ambulatory Visit (HOSPITAL_COMMUNITY)
Admission: RE | Admit: 2023-10-30 | Discharge: 2023-10-30 | Disposition: A | Discharge status: Home / Self Care | Attending: Orthopedic Surgery | Admitting: Orthopedic Surgery

## 2023-10-30 ENCOUNTER — Other Ambulatory Visit: Payer: Self-pay

## 2023-10-30 ENCOUNTER — Encounter (HOSPITAL_COMMUNITY)
Admission: RE | Disposition: A | Discharge status: Home / Self Care | Payer: Self-pay | Source: Home / Self Care | Attending: Orthopedic Surgery

## 2023-10-30 ENCOUNTER — Ambulatory Visit (HOSPITAL_COMMUNITY)

## 2023-10-30 ENCOUNTER — Encounter (HOSPITAL_COMMUNITY): Payer: Self-pay | Admitting: Orthopedic Surgery

## 2023-10-30 DIAGNOSIS — M19012 Primary osteoarthritis, left shoulder: Secondary | ICD-10-CM | POA: Diagnosis not present

## 2023-10-30 DIAGNOSIS — K219 Gastro-esophageal reflux disease without esophagitis: Secondary | ICD-10-CM | POA: Insufficient documentation

## 2023-10-30 DIAGNOSIS — Z87891 Personal history of nicotine dependence: Secondary | ICD-10-CM | POA: Diagnosis not present

## 2023-10-30 DIAGNOSIS — G4730 Sleep apnea, unspecified: Secondary | ICD-10-CM | POA: Diagnosis not present

## 2023-10-30 DIAGNOSIS — G8918 Other acute postprocedural pain: Secondary | ICD-10-CM | POA: Diagnosis not present

## 2023-10-30 DIAGNOSIS — M75102 Unspecified rotator cuff tear or rupture of left shoulder, not specified as traumatic: Secondary | ICD-10-CM | POA: Insufficient documentation

## 2023-10-30 DIAGNOSIS — Z96612 Presence of left artificial shoulder joint: Secondary | ICD-10-CM | POA: Diagnosis not present

## 2023-10-30 DIAGNOSIS — G20A1 Parkinson's disease without dyskinesia, without mention of fluctuations: Secondary | ICD-10-CM | POA: Diagnosis not present

## 2023-10-30 DIAGNOSIS — Z79899 Other long term (current) drug therapy: Secondary | ICD-10-CM | POA: Diagnosis not present

## 2023-10-30 DIAGNOSIS — Z471 Aftercare following joint replacement surgery: Secondary | ICD-10-CM | POA: Diagnosis not present

## 2023-10-30 HISTORY — PX: REVERSE SHOULDER ARTHROPLASTY: SHX5054

## 2023-10-30 SURGERY — ARTHROPLASTY, SHOULDER, TOTAL, REVERSE
Anesthesia: General | Site: Shoulder | Laterality: Left

## 2023-10-30 MED ORDER — LACTATED RINGERS IV SOLN: INTRAVENOUS | Status: DC

## 2023-10-30 MED ORDER — DEXAMETHASONE SODIUM PHOSPHATE 10 MG/ML IJ SOLN
INTRAMUSCULAR | Status: AC
  Filled 2023-10-30: qty 1

## 2023-10-30 MED ORDER — DROPERIDOL 2.5 MG/ML IJ SOLN: 0.6250 mg | Freq: Once | INTRAMUSCULAR | Status: DC | PRN

## 2023-10-30 MED ORDER — ONDANSETRON HCL 4 MG/2ML IJ SOLN
INTRAMUSCULAR | Status: DC | PRN
Start: 2023-10-30 — End: 2023-10-30
  Administered 2023-10-30: 4 mg via INTRAVENOUS

## 2023-10-30 MED ORDER — ONDANSETRON HCL 4 MG PO TABS: 4.0000 mg | ORAL_TABLET | Freq: Three times a day (TID) | ORAL | 0 refills | Status: AC | PRN

## 2023-10-30 MED ORDER — CHLORHEXIDINE GLUCONATE 0.12 % MT SOLN
15.0000 mL | Freq: Once | OROMUCOSAL | Status: AC
  Administered 2023-10-30: 15 mL via OROMUCOSAL

## 2023-10-30 MED ORDER — PHENYLEPHRINE HCL-NACL 20-0.9 MG/250ML-% IV SOLN
INTRAVENOUS | Status: DC | PRN
  Administered 2023-10-30: 20 ug/min via INTRAVENOUS

## 2023-10-30 MED ORDER — LIDOCAINE HCL (CARDIAC) PF 100 MG/5ML IV SOSY
PREFILLED_SYRINGE | INTRAVENOUS | Status: DC | PRN
  Administered 2023-10-30: 100 mg via INTRAVENOUS

## 2023-10-30 MED ORDER — DEXAMETHASONE SODIUM PHOSPHATE 10 MG/ML IJ SOLN
INTRAMUSCULAR | Status: DC | PRN
  Administered 2023-10-30: 8 mg via INTRAVENOUS

## 2023-10-30 MED ORDER — OXYCODONE HCL 5 MG PO TABS: 5.0000 mg | ORAL_TABLET | Freq: Once | ORAL | Status: DC | PRN

## 2023-10-30 MED ORDER — POVIDONE-IODINE 10 % EX SWAB
2.0000 | Freq: Once | CUTANEOUS | Status: AC
  Administered 2023-10-30: 2 via TOPICAL

## 2023-10-30 MED ORDER — BUPIVACAINE LIPOSOME 1.3 % IJ SUSP
INTRAMUSCULAR | Status: DC | PRN
  Administered 2023-10-30: 10 mL via PERINEURAL

## 2023-10-30 MED ORDER — PHENYLEPHRINE HCL (PRESSORS) 10 MG/ML IV SOLN
INTRAVENOUS | Status: AC
  Filled 2023-10-30: qty 1

## 2023-10-30 MED ORDER — PROPOFOL 10 MG/ML IV BOLUS
INTRAVENOUS | Status: DC | PRN
  Administered 2023-10-30: 140 mg via INTRAVENOUS

## 2023-10-30 MED ORDER — TRANEXAMIC ACID-NACL 1000-0.7 MG/100ML-% IV SOLN
1000.0000 mg | INTRAVENOUS | Status: DC
  Filled 2023-10-30: qty 100

## 2023-10-30 MED ORDER — TRANEXAMIC ACID-NACL 1000-0.7 MG/100ML-% IV SOLN
INTRAVENOUS | Status: DC | PRN
  Administered 2023-10-30: 1000 mg via INTRAVENOUS

## 2023-10-30 MED ORDER — ONDANSETRON HCL 4 MG/2ML IJ SOLN
INTRAMUSCULAR | Status: AC
  Filled 2023-10-30: qty 2

## 2023-10-30 MED ORDER — MIDAZOLAM HCL 2 MG/2ML IJ SOLN
1.0000 mg | INTRAMUSCULAR | Status: DC | PRN
  Administered 2023-10-30: 1 mg via INTRAVENOUS
  Filled 2023-10-30: qty 2

## 2023-10-30 MED ORDER — BUPIVACAINE-EPINEPHRINE (PF) 0.5% -1:200000 IJ SOLN
INTRAMUSCULAR | Status: DC | PRN
  Administered 2023-10-30: 15 mL via PERINEURAL

## 2023-10-30 MED ORDER — FENTANYL CITRATE PF 50 MCG/ML IJ SOSY: 25.0000 ug | PREFILLED_SYRINGE | INTRAMUSCULAR | Status: DC | PRN

## 2023-10-30 MED ORDER — FENTANYL CITRATE PF 50 MCG/ML IJ SOSY
50.0000 ug | PREFILLED_SYRINGE | INTRAMUSCULAR | Status: AC | PRN
  Administered 2023-10-30: 50 ug via INTRAVENOUS
  Filled 2023-10-30: qty 2

## 2023-10-30 MED ORDER — 0.9 % SODIUM CHLORIDE (POUR BTL) OPTIME
TOPICAL | Status: DC | PRN
  Administered 2023-10-30: 1000 mL

## 2023-10-30 MED ORDER — STERILE WATER FOR IRRIGATION IR SOLN
Status: DC | PRN
  Administered 2023-10-30: 2000 mL

## 2023-10-30 MED ORDER — ACETAMINOPHEN 500 MG PO TABS
1000.0000 mg | ORAL_TABLET | Freq: Once | ORAL | Status: AC
  Administered 2023-10-30: 1000 mg via ORAL
  Filled 2023-10-30: qty 2

## 2023-10-30 MED ORDER — ROCURONIUM BROMIDE 100 MG/10ML IV SOLN
INTRAVENOUS | Status: DC | PRN
  Administered 2023-10-30: 10 mg via INTRAVENOUS
  Administered 2023-10-30: 60 mg via INTRAVENOUS

## 2023-10-30 MED ORDER — ORAL CARE MOUTH RINSE
15.0000 mL | Freq: Once | OROMUCOSAL | Status: AC
Start: 2023-10-30 — End: 2023-10-30

## 2023-10-30 MED ORDER — ROCURONIUM BROMIDE 10 MG/ML (PF) SYRINGE
PREFILLED_SYRINGE | INTRAVENOUS | Status: AC
  Filled 2023-10-30: qty 10

## 2023-10-30 MED ORDER — PROPOFOL 10 MG/ML IV BOLUS
INTRAVENOUS | Status: AC
  Filled 2023-10-30: qty 20

## 2023-10-30 MED ORDER — LACTATED RINGERS IV SOLN: INTRAVENOUS | Status: DC | PRN

## 2023-10-30 MED ORDER — SUGAMMADEX SODIUM 200 MG/2ML IV SOLN
INTRAVENOUS | Status: DC | PRN
  Administered 2023-10-30: 160 mg via INTRAVENOUS

## 2023-10-30 MED ORDER — LIDOCAINE HCL (PF) 2 % IJ SOLN
INTRAMUSCULAR | Status: AC
Start: 2023-10-30 — End: ?
  Filled 2023-10-30: qty 5

## 2023-10-30 MED ORDER — OXYCODONE HCL 5 MG PO TABS: 5.0000 mg | ORAL_TABLET | ORAL | 0 refills | Status: AC | PRN

## 2023-10-30 MED ORDER — CEFAZOLIN SODIUM-DEXTROSE 2-4 GM/100ML-% IV SOLN
2.0000 g | INTRAVENOUS | Status: AC
Start: 2023-10-30 — End: 2023-10-30
  Administered 2023-10-30: 2 g via INTRAVENOUS
  Filled 2023-10-30: qty 100

## 2023-10-30 MED ORDER — FENTANYL CITRATE (PF) 100 MCG/2ML IJ SOLN
INTRAMUSCULAR | Status: AC
Start: 2023-10-30 — End: ?
  Filled 2023-10-30: qty 2

## 2023-10-30 MED ORDER — OXYCODONE HCL 5 MG/5ML PO SOLN: 5.0000 mg | Freq: Once | ORAL | Status: DC | PRN

## 2023-10-30 MED ORDER — SENNA-DOCUSATE SODIUM 8.6-50 MG PO TABS: 2.0000 | ORAL_TABLET | Freq: Every day | ORAL | 1 refills | Status: AC

## 2023-10-30 MED ORDER — FENTANYL CITRATE (PF) 100 MCG/2ML IJ SOLN
INTRAMUSCULAR | Status: DC | PRN
  Administered 2023-10-30 (×2): 50 ug via INTRAVENOUS

## 2023-10-30 MED ORDER — PHENYLEPHRINE 80 MCG/ML (10ML) SYRINGE FOR IV PUSH (FOR BLOOD PRESSURE SUPPORT)
PREFILLED_SYRINGE | INTRAVENOUS | Status: AC
Start: 2023-10-30 — End: ?
  Filled 2023-10-30: qty 10

## 2023-10-30 SURGICAL SUPPLY — 56 items
BAG COUNTER SPONGE SURGICOUNT (BAG) IMPLANT
BAG ZIPLOCK 12X15 (MISCELLANEOUS) ×1 IMPLANT
BASEPLATE GLENOSPHERE 25 (Plate) IMPLANT
BEARING HUMERAL SHLDER 36M STD (Shoulder) IMPLANT
BIT DRILL TWIST 2.7 (BIT) IMPLANT
BLADE SAW SGTL 73X25 THK (BLADE) ×1 IMPLANT
CEMENT BONE DEPUY (Cement) IMPLANT
CLSR STERI-STRIP ANTIMIC 1/2X4 (GAUZE/BANDAGES/DRESSINGS) ×1 IMPLANT
COOLER ICEMAN CLASSIC (MISCELLANEOUS) ×1 IMPLANT
COVER BACK TABLE 60X90IN (DRAPES) ×1 IMPLANT
COVER SURGICAL LIGHT HANDLE (MISCELLANEOUS) ×1 IMPLANT
DRAPE POUCH INSTRU U-SHP 10X18 (DRAPES) ×1 IMPLANT
DRAPE SHEET LG 3/4 BI-LAMINATE (DRAPES) ×2 IMPLANT
DRAPE SURG 17X11 SM STRL (DRAPES) ×1 IMPLANT
DRAPE SURG 17X23 STRL (DRAPES) ×1 IMPLANT
DRAPE SURG ORHT 6 SPLT 77X108 (DRAPES) ×2 IMPLANT
DRAPE TOP 10253 STERILE (DRAPES) ×1 IMPLANT
DRAPE U-SHAPE 47X51 STRL (DRAPES) ×1 IMPLANT
DRSG MEPILEX POST OP 4X8 (GAUZE/BANDAGES/DRESSINGS) ×1 IMPLANT
DURAPREP 26ML APPLICATOR (WOUND CARE) ×2 IMPLANT
ELECT BLADE TIP CTD 4 INCH (ELECTRODE) ×1 IMPLANT
ELECT REM PT RETURN 15FT ADLT (MISCELLANEOUS) ×1 IMPLANT
FACESHIELD WRAPAROUND (MASK) ×1 IMPLANT
FACESHIELD WRAPAROUND OR TEAM (MASK) ×1 IMPLANT
GLENOID SPHERE STD STRL 36MM (Orthopedic Implant) IMPLANT
GLOVE BIO SURGEON STRL SZ 6.5 (GLOVE) ×1 IMPLANT
GLOVE BIOGEL PI IND STRL 7.0 (GLOVE) ×1 IMPLANT
GLOVE BIOGEL PI IND STRL 8 (GLOVE) ×1 IMPLANT
GLOVE ORTHO TXT STRL SZ7.5 (GLOVE) ×1 IMPLANT
GOWN STRL SURGICAL XL XLNG (GOWN DISPOSABLE) ×2 IMPLANT
HOOD PEEL AWAY T7 (MISCELLANEOUS) ×2 IMPLANT
KIT BASIN OR (CUSTOM PROCEDURE TRAY) ×1 IMPLANT
KIT TURNOVER KIT A (KITS) ×1 IMPLANT
PACK SHOULDER (CUSTOM PROCEDURE TRAY) ×1 IMPLANT
PAD COLD SHLDR WRAP-ON (PAD) ×1 IMPLANT
PIN STEINMANN THREADED TIP (PIN) IMPLANT
PIN THREADED REVERSE (PIN) IMPLANT
RESTRAINT HEAD UNIVERSAL NS (MISCELLANEOUS) ×1 IMPLANT
SCREW BONE LOCKING 4.75X30X3.5 (Screw) IMPLANT
SCREW BONE STRL 6.5MMX25MM (Screw) IMPLANT
SCREW LOCKING NS 4.75MMX20MM (Screw) IMPLANT
SLING ARM IMMOBILIZER LRG (SOFTGOODS) ×1 IMPLANT
SLING ARM IMMOBILIZER XL (CAST SUPPLIES) IMPLANT
SPONGE T-LAP 4X18 ~~LOC~~+RFID (SPONGE) IMPLANT
STEM HUMERAL STRL 12MMX14MM (Stem) IMPLANT
SUCTION TUBE FRAZIER 12FR DISP (SUCTIONS) ×1 IMPLANT
SUPPORT WRAP ARM LG (MISCELLANEOUS) ×1 IMPLANT
SUT MAXBRAID #2 CVD NDL (SUTURE) IMPLANT
SUT MAXBRAID #5 CCS-NDL 2PK (SUTURE) IMPLANT
SUT MNCRL AB 3-0 PS2 18 (SUTURE) ×1 IMPLANT
SUT VIC AB 1 CT1 36 (SUTURE) ×1 IMPLANT
SUT VIC AB 2-0 CT1 TAPERPNT 27 (SUTURE) ×1 IMPLANT
TOWEL OR 17X26 10 PK STRL BLUE (TOWEL DISPOSABLE) ×1 IMPLANT
TOWER SMARTMIX MINI (MISCELLANEOUS) IMPLANT
TRAY HUM MINI SHOULDER +0 40D (Shoulder) IMPLANT
TUBE SUCTION HIGH CAP CLEAR NV (SUCTIONS) ×1 IMPLANT

## 2023-10-30 NOTE — Evaluation (Signed)
 Occupational Therapy Evaluation Patient Details Name: Larry Miller MRN: [id number] DOB: 01-Jul-1950 Today's Date: 10/30/2023   History of Present Illness   Patient is a 73 year old male who presented with Left shoulder irreparable rotator cuff tear. Patient underwent left reverse total shoulder arthroplasty on 6/3. PMH: arthritis, R total hip, L rotator cuff repair 2020, parkinson disease.     Clinical Impressions s/p shoulder replacement without functional use of LUE secondary to effects of surgery and interscalene block and shoulder precautions. Therapist provided education and instruction to patient and wife in regards to exercises, precautions, positioning, donning upper extremity clothing and bathing while maintaining shoulder precautions, ice and edema management, use of ice machine and donning/doffing sling. Patient and wife verbalized understanding and demonstrated as needed. Patient needed assistance to donn shirt, underwear, pants, socks and shoes and provided with instruction on compensatory strategies to perform ADLs. Patient to follow up with MD for further therapy needs.        Functional Status Assessment   Patient has had a recent decline in their functional status and demonstrates the ability to make significant improvements in function in a reasonable and predictable amount of time.     Equipment Recommendations   None recommended by OT      Precautions/Restrictions   Precautions Precautions: Fall Precaution/Restrictions Comments: no shoulder ROM, ok for AROM elbow hand and wrist. Restrictions Weight Bearing Restrictions Per Provider Order: Yes LUE Weight Bearing Per Provider Order: Non weight bearing     Mobility Bed Mobility               General bed mobility comments: in PACU                    ADL either performed or assessed with clinical judgement   ADL                                         General ADL  Comments: patient completed toileting with CGA with patient reporting he had not had parkinsons meds today. wife able to provide CGA during this time and has done so in past per wife report. wife able to demonstrate A to complete dressing tasks.Per orders, shoulder parameters as follows for ADL tasks:No shoulder ROM; elbow/wrist/hand ROM only. While moving within specified parameters, pt/caregiver instructed on bathing and how to donn/doff shirt, placing operative arm through sleeve first when donning and off last when doffing.Pt/caregiver educated on compensatory strategies for LB ADL and strategies to reduce risk of falls.  Pt/caregiver educated on donning/doffing sling and to wear the sling at all times with the exception of ADL, and to loosen the neck strap of the sling when the operative arm is in a supported position when sitting. In sitting or supine, pt instructed to have a pillow behind and under their operative arm to provide support. If assist needed with ambulation, caregiver educated on the importance of walking on pt's non-operative side.  Education regarding use of "IceMan" Cold Therapy completed, including the importance of using a barrier on the shoulder prior to positioning the wrap-on pad. Pt/caregiver verbalized/demonstrated understanding. Teach Back used while caregiver assisted with dressing pt and positioning "wrap-on pad" to facilitate DC.     Vision   Vision Assessment?: No apparent visual deficits            Pertinent Vitals/Pain Pain Assessment Pain Assessment: No/denies pain (  block in place)     Extremity/Trunk Assessment Upper Extremity Assessment Upper Extremity Assessment: LUE deficits/detail LUE Deficits / Details: block in place           Communication     Cognition Arousal: Alert Behavior During Therapy: WFL for tasks assessed/performed               OT - Cognition Comments: appears to be at baseline with wife present                                Shoulder Instructions Shoulder Instructions Donning/doffing shirt without moving shoulder: Caregiver independent with task;Minimal assistance Method for sponge bathing under operated UE: Caregiver independent with task Donning/doffing sling/immobilizer: Caregiver independent with task Correct positioning of sling/immobilizer: Caregiver independent with task (have video on wifes phone for daughter to see sling.) ROM for elbow, wrist and digits of operated UE: Caregiver independent with task Sling wearing schedule (on at all times/off for ADL's): Caregiver independent with task Proper positioning of operated UE when showering: Caregiver independent with task Positioning of UE while sleeping: Caregiver independent with task    Home Living Family/patient expects to be discharged to:: Private residence Living Arrangements: Spouse/significant other;Children Available Help at Discharge: Family;Available 24 hours/day                                        OT Problem List: Decreased knowledge of precautions;Decreased safety awareness;Impaired UE functional use;Pain   OT Treatment/Interventions:        OT Goals(Current goals can be found in the care plan section)   Acute Rehab OT Goals OT Goal Formulation: All assessment and education complete, DC therapy   OT Frequency:             End of Session    Activity Tolerance: Patient tolerated treatment well Patient left: in chair;with family/visitor present (in PACU)  OT Visit Diagnosis: Pain Pain - Right/Left: Left Pain - part of body: Shoulder                Time: 1630-1701 OT Time Calculation (min): 31 min Charges:  OT General Charges $OT Visit: 1 Visit OT Evaluation $OT Eval Low Complexity: 1 Low OT Treatments $Self Care/Home Management : 8-22 mins  Wynette Heckler, MS Acute Rehabilitation Department Office# [phone]   Jame Maze 10/30/2023, 5:10 PM

## 2023-10-30 NOTE — Anesthesia Preprocedure Evaluation (Addendum)
 Anesthesia Evaluation  Patient identified by MRN, date of birth, ID band Patient awake    Reviewed: Allergy & Precautions, NPO status , Patient's Chart, lab work & pertinent test results  History of Anesthesia Complications Negative for: history of anesthetic complications  Airway Mallampati: III  TM Distance: >3 FB Neck ROM: Full    Dental no notable dental hx.    Pulmonary sleep apnea , former smoker   Pulmonary exam normal        Cardiovascular negative cardio ROS Normal cardiovascular exam     Neuro/Psych Parkinson's dx    GI/Hepatic Neg liver ROS,GERD  Medicated,,  Endo/Other  negative endocrine ROS    Renal/GU negative Renal ROS  negative genitourinary   Musculoskeletal  (+) Arthritis ,    Abdominal   Peds  Hematology negative hematology ROS (+)   Anesthesia Other Findings Day of surgery medications reviewed with patient.  Reproductive/Obstetrics                              Anesthesia Physical Anesthesia Plan  ASA: 2  Anesthesia Plan: General   Post-op Pain Management: Tylenol  PO (pre-op)* and Regional block*   Induction: Intravenous  PONV Risk Score and Plan: 2 and Treatment may vary due to age or medical condition, Ondansetron , Dexamethasone  and Midazolam   Airway Management Planned: Oral ETT  Additional Equipment: None  Intra-op Plan:   Post-operative Plan: Extubation in OR  Informed Consent: I have reviewed the patients History and Physical, chart, labs and discussed the procedure including the risks, benefits and alternatives for the proposed anesthesia with the patient or authorized representative who has indicated his/her understanding and acceptance.     Dental advisory given  Plan Discussed with: CRNA  Anesthesia Plan Comments:         Anesthesia Quick Evaluation

## 2023-10-30 NOTE — Anesthesia Postprocedure Evaluation (Signed)
 Anesthesia Post Note  Patient: Larry Miller  Procedure(s) Performed: ARTHROPLASTY, SHOULDER, TOTAL, REVERSE (Left: Shoulder)     Patient location during evaluation: PACU Anesthesia Type: General Level of consciousness: awake and alert Pain management: pain level controlled Vital Signs Assessment: post-procedure vital signs reviewed and stable Respiratory status: spontaneous breathing, nonlabored ventilation and respiratory function stable Cardiovascular status: blood pressure returned to baseline Postop Assessment: no apparent nausea or vomiting Anesthetic complications: no   No notable events documented.  Last Vitals:  Vitals:   10/30/23 1605 10/30/23 1630  BP: 127/79 127/79  Pulse: 65 (!) 54  Resp: (!) 23   Temp: (!) 36.1 C   SpO2: 98% 96%    Last Pain:  Vitals:   10/30/23 1605  TempSrc:   PainSc: 0-No pain                 Rayfield Cairo

## 2023-10-30 NOTE — Anesthesia Procedure Notes (Signed)
 Procedure Name: Intubation Date/Time: 10/30/2023 1:29 PM  Performed by: Darlena Ego, CRNAPre-anesthesia Checklist: Patient identified, Emergency Drugs available, Suction available and Patient being monitored Patient Re-evaluated:Patient Re-evaluated prior to induction Oxygen  Delivery Method: Circle System Utilized Preoxygenation: Pre-oxygenation with 100% oxygen  Induction Type: IV induction Ventilation: Mask ventilation without difficulty Laryngoscope Size: Miller and 2 Grade View: Grade I Tube type: Oral Tube size: 7.5 mm Number of attempts: 1 Airway Equipment and Method: Stylet and Oral airway Placement Confirmation: ETT inserted through vocal cords under direct vision, positive ETCO2 and breath sounds checked- equal and bilateral Secured at: 23 cm Tube secured with: Tape Dental Injury: Teeth and Oropharynx as per pre-operative assessment

## 2023-10-30 NOTE — Transfer of Care (Signed)
 Immediate Anesthesia Transfer of Care Note  Patient: CASTULO SCARPELLI  Procedure(s) Performed: ARTHROPLASTY, SHOULDER, TOTAL, REVERSE (Left: Shoulder)  Patient Location: PACU  Anesthesia Type:General  Level of Consciousness: sedated and responds to stimulation  Airway & Oxygen  Therapy: Patient Spontanous Breathing and Patient connected to face mask oxygen   Post-op Assessment: Report given to RN  Post vital signs: Reviewed and stable  Last Vitals:  Vitals Value Taken Time  BP 153/114 10/30/23 1524  Temp    Pulse 72 10/30/23 1525  Resp 21 10/30/23 1525  SpO2 82 % 10/30/23 1525  Vitals shown include unfiled device data.  Last Pain:  Vitals:   10/30/23 1305  TempSrc:   PainSc: 0-No pain      Patients Stated Pain Goal: 5 (10/30/23 0958)  Complications: No notable events documented.

## 2023-10-30 NOTE — Anesthesia Procedure Notes (Signed)
 Anesthesia Regional Block: Interscalene brachial plexus block   Pre-Anesthetic Checklist: , timeout performed,  Correct Patient, Correct Site, Correct Laterality,  Correct Procedure, Correct Position, site marked,  Risks and benefits discussed,  Pre-op evaluation,  At surgeon's request and post-op pain management  Laterality: Left  Prep: Maximum Sterile Barrier Precautions used, chloraprep       Needles:  Injection technique: Single-shot  Needle Type: Echogenic Stimulator Needle     Needle Length: 4cm  Needle Gauge: 22     Additional Needles:   Procedures:,,,, ultrasound used (permanent image in chart),,    Narrative:  Start time: 10/30/2023 12:51 PM End time: 10/30/2023 12:54 PM Injection made incrementally with aspirations every 5 mL.  Performed by: Personally  Anesthesiologist: Vernadine Golas, MD  Additional Notes: Risks, benefits, and alternative discussed. Patient gave consent for procedure. Patient prepped and draped in sterile fashion. Sedation administered, patient remains easily responsive to voice. Relevant anatomy identified with ultrasound guidance. Local anesthetic given in 5cc increments with no signs or symptoms of intravascular injection. No pain or paraesthesias with injection. Patient monitored throughout procedure with signs of LAST or immediate complications. Tolerated well. Ultrasound image placed in chart.  Larry Junes, MD

## 2023-10-30 NOTE — Interval H&P Note (Signed)
 History and Physical Interval Note:  10/30/2023 11:14 AM  Larry Miller  has presented today for surgery, with the diagnosis of osteoarthritis of left shoulder.  The various methods of treatment have been discussed with the patient and family. After consideration of risks, benefits and other options for treatment, the patient has consented to  Procedure(s): ARTHROPLASTY, SHOULDER, TOTAL, REVERSE (Left) as a surgical intervention.  The patient's history has been reviewed, patient examined, no change in status, stable for surgery.  I have reviewed the patient's chart and labs.  Questions were answered to the patient's satisfaction.     [name]

## 2023-10-30 NOTE — Op Note (Signed)
 10/30/2023  2:54 PM  PATIENT:  Larry Miller    PRE-OPERATIVE DIAGNOSIS: Left shoulder irreparable rotator cuff tear  POST-OPERATIVE DIAGNOSIS:  Same  PROCEDURE: LEFT reverse Total Shoulder Arthroplasty  SURGEON:  [name], MD  PHYSICIAN ASSISTANT: Hurshel Maidens, PA-C, present and scrubbed throughout the case, critical for completion in a timely fashion, and for retraction, instrumentation, and closure.  ANESTHESIA:   General with interscalene block using Exparel   ESTIMATED BLOOD LOSS: 100 mL  UNIQUE ASPECTS OF THE CASE: The biceps tenodesis looked like it actually had held, the subscapularis tissue was relatively poor quality, but I did achieve a repair.  I performed the humeral head cut slightly higher than would be typical for a reverse, and intentionally I made him slightly tighter than standard given his Parkinson's, and high risk for instability.  PREOPERATIVE INDICATIONS:  Larry Miller is a  73 y.o. male with a diagnosis of left shoulder irreparable rotator cuff tear who failed conservative measures and elected for surgical management.  He had a previous subscapularis repair with biceps tenodesis.  The risks benefits and alternatives were discussed with the patient preoperatively including but not limited to the risks of infection, bleeding, nerve injury, cardiopulmonary complications, the need for revision surgery, dislocation, brachial plexus palsy, incomplete relief of pain, among others, and the patient was willing to proceed.  OPERATIVE IMPLANTS:   Implant Name: Saddie Crane STD STRL - [id number] Type: Orthopedic Implant Inv. Item: GLENOID SPHERE STD STRL Serial No.:  Manufacturer: [employer](ORTH,TRAU,BIO,SG) Lot No.: [id number] LRB: Left No. Used: 1 Action: Implanted   Implant Name: SCREW BONE LOCKING 7.82N56O1.5 - [id number] Type: Screw Inv. Item: SCREW BONE LOCKING [id number] Serial No.:  Manufacturer: [employer](ORTH,TRAU,BIO,SG) Lot No.:  [id number] LRB: Left No. Used: 1 Action: Implanted   Implant Name: SCREW LOCKING NS [id number] - [id number] Type: Screw Inv. Item: SCREW LOCKING NS [id number] Serial No.:  Manufacturer: [employer](ORTH,TRAU,BIO,SG) Lot No.: [id number] LRB: Left No. Used: 1 Action: Implanted   Implant Name: BEARING HUMERAL SHLDER 59M STD - [id number] Type: Shoulder Inv. Item: BEARING HUMERAL SHLDER 59M STD Serial No.:  Manufacturer: [employer](ORTH,TRAU,BIO,SG) Lot No.: [id number] LRB: Left No. Used: 1 Action: Implanted   Implant Name: SCREW BONE STRL [id number] - [id number] Type: Screw Inv. Item: SCREW BONE STRL [id number] Serial No.:  Manufacturer: [employer](ORTH,TRAU,BIO,SG) Lot No.: [id number] LRB: Left No. Used: 1 Action: Implanted   Implant Name: BASEPLATE GLENOSPHERE 25 - [id number] Type: Plate Inv. Item: BASEPLATE GLENOSPHERE 25 Serial No.:  Manufacturer: [employer](ORTH,TRAU,BIO,SG) Lot No.: [id number] LRB: Left No. Used: 1 Action: Implanted   Implant Name: Etheleen Her MINI SHOULDER +0 40D - [id number] Type: Shoulder Inv. Item: TRAY HUM MINI SHOULDER +0 40D Serial No.:  Manufacturer: [employer](ORTH,TRAU,BIO,SG) Lot No.: [id number] LRB: Left No. Used: 1 Action: Implanted   Implant Name: STEM HUMERAL STRL [id number] - [id number] Type: Stem Inv. Item: STEM HUMERAL STRL [id number] Serial No.:  Manufacturer: [employer](ORTH,TRAU,BIO,SG) Lot No.: [id number] LRB: Left No. Used: 1 Action: Implanted   OPERATIVE FINDINGS: Irreparable supraspinatus tear with poor quality subscapularis tissue  OPERATIVE PROCEDURE: The patient was brought to the operating room and placed in the supine position. General anesthesia was administered. IV antibiotics were given.  Time out was performed. The upper extremity was prepped and draped in usual sterile fashion. The patient was in a beachchair position. Deltopectoral approach was carried out. The biceps was tenodesed to the  pectoralis tendon with #2 Maxbraid. The subscapularis was released off of the bone.  I then performed circumferential releases of the humerus, and then dislocated the head, and then reamed with the reamer to the above named size.  I then applied the jig, and cut the humeral head in 30 of retroversion, and then turned my attention to the glenoid.  Deep retractors were placed, and I resected the labrum, and then placed a guidepin into the center position on the glenoid, with slight inferior inclination. I then reamed over the guidepin, and this created a small metaphyseal cancellus blush inferiorly, removing just the cartilage to the subchondral bone superiorly. The base plate was selected and impacted place, and then I secured it centrally with a nonlocking screw, and I had excellent purchase both inferiorly and superiorly. I placed a short locking screws on anterior and posterior aspects.  I then turned my attention to the glenosphere, and impacted this into place, placing slight inferior offset (set on B).   The glenosphere was completely seated, and had engagement of the [facility] taper. I then turned my attention back to the humerus.  I sequentially broached, and then trialed, and was found to restore soft tissue tension, and it had 2 finger tightness. Therefore the above named components were selected. The shoulder felt stable throughout functional motion.  Before I placed the real prosthesis I had also placed a total of 1 #2 and 2 #5 Maxbraid through the humerus for later subscapularis repair.  I then impacted the real prosthesis into place, as well as the real humeral tray, and reduced the shoulder. The shoulder had excellent motion, and was stable, and I irrigated the wounds copiously.    I then used the Maxbraid suture to repair the subscapularis. This came down to bone.  I then irrigated the shoulder copiously once more, repaired the deltopectoral interval with Vicryl followed by  subcutaneous Vicryl with Steri-Strips and sterile gauze for the skin. The patient was awakened and returned back in stable and satisfactory condition. There were no complications and He tolerated the procedure well.

## 2023-10-30 NOTE — Discharge Instructions (Signed)
 POST-OPERATIVE INSTRUCTIONS - TOTAL SHOULDER REPLACEMENT    WOUND CARE You may leave the operative dressing in place until your follow-up appointment. KEEP THE INCISIONS CLEAN AND DRY. There may be a small amount of fluid/bleeding leaking at the surgical site. This is normal after surgery.  If it fills with liquid or blood please call us  immediately to change it for you. Use the provided ice machine or Ice packs as often as possible for the first 3-4 days, then as needed for pain relief.   Keep a layer of cloth or a shirt between your skin and the cooling unit to prevent frost bite as it can get very cold.  SHOWERING: - The dressing is water  resistant but do not scrub it as it may start to peel up. Please keep covered when showering with plastic. - You may remove the sling for showering - Gently pat the area dry.  - Do not soak the shoulder in water .  - Do not go swimming in the pool or ocean until your incision has completely healed (about 4-6 weeks after surgery) - KEEP THE INCISIONS CLEAN AND DRY.  EXERCISES Wear the sling at all times  You may remove the sling for showering, but keep the arm across the chest or in a secondary sling.    It is ok to come out of your sling if your are sitting and have assistance for eating.   Do not lift anything heavier than 1 pound until we discuss it further in clinic. It is normal for your fingers/hand to become more swollen after surgery and discolored from bruising.   This will resolve over the first few weeks usually after surgery. Please continue to ambulate and do not stay sitting or lying for too long.  Perform foot and wrist pumps to assist in circulation.  REGIONAL ANESTHESIA (NERVE BLOCKS) The anesthesia team may have performed a nerve block for you this is a great tool used to minimize pain.   The block may start wearing off overnight (between 8-24 hours postop) When the block wears off, your pain may go from nearly zero to the pain  you would have had postop without the block. This is an abrupt transition but nothing dangerous is happening.   This can be a challenging period but utilize your as needed pain medications to try and manage this period. We suggest you use the pain medication the first night prior to going to bed, to ease this transition.  You may take an extra dose of narcotic when this happens if needed  FOLLOW-UP If you develop a Fever (>101.5), Redness or Drainage from the surgical incision site, please call our office to arrange for an evaluation. Please call the office to schedule a follow-up appointment for a wound check, 7-10 days post-operatively.  IF YOU HAVE ANY QUESTIONS, PLEASE FEEL FREE TO CALL OUR OFFICE.  HELPFUL INFORMATION  Your arm will be in a sling following surgery.   You may be more comfortable sleeping in a semi-seated position the first few nights following surgery.  Keep a pillow propped under the elbow and forearm for comfort.  If you have a recliner type of chair it might be beneficial.  If not that is fine too, but it would be helpful to sleep propped up with pillows behind your operated shoulder as well under your elbow and forearm.  This will reduce pulling on the suture lines.  When dressing, put your operative arm in the sleeve first.  When getting  undressed, take your operative arm out last.  Loose fitting, button-down shirts are recommended.  In most states it is against the law to drive while your arm is in a sling. And certainly against the law to drive while taking narcotics.  You may return to work/school in the next couple of days when you feel up to it. Desk work and typing in the sling is fine.  We suggest you use the pain medication the first night prior to going to bed, in order to ease any pain when the anesthesia wears off. You should avoid taking pain medications on an empty stomach as it will make you nauseous.  You should wean off your narcotic medicines as  soon as you are able.     Most patients will be off narcotics before their first postop appointment.   Do not drink alcoholic beverages or take illicit drugs when taking pain medications.  Pain medication may make you constipated.  Below are a few solutions to try in this order: Decrease the amount of pain medication if you aren't having pain. Drink lots of decaffeinated fluids. Drink prune juice and/or each dried prunes  If the first 3 don't work start with additional solutions Take Colace - an over-the-counter stool softener Take Senokot - an over-the-counter laxative Take Miralax  - a stronger over-the-counter laxative

## 2023-11-01 ENCOUNTER — Encounter (HOSPITAL_COMMUNITY): Payer: Self-pay | Admitting: Orthopedic Surgery

## 2023-11-06 DIAGNOSIS — M25612 Stiffness of left shoulder, not elsewhere classified: Secondary | ICD-10-CM | POA: Diagnosis not present

## 2023-11-06 DIAGNOSIS — G20C Parkinsonism, unspecified: Secondary | ICD-10-CM | POA: Diagnosis not present

## 2023-11-06 DIAGNOSIS — M6281 Muscle weakness (generalized): Secondary | ICD-10-CM | POA: Diagnosis not present

## 2023-11-06 DIAGNOSIS — R293 Abnormal posture: Secondary | ICD-10-CM | POA: Diagnosis not present

## 2023-11-06 DIAGNOSIS — M25512 Pain in left shoulder: Secondary | ICD-10-CM | POA: Diagnosis not present

## 2023-11-13 DIAGNOSIS — M25512 Pain in left shoulder: Secondary | ICD-10-CM | POA: Diagnosis not present

## 2023-11-13 DIAGNOSIS — G20C Parkinsonism, unspecified: Secondary | ICD-10-CM | POA: Diagnosis not present

## 2023-11-13 DIAGNOSIS — M6281 Muscle weakness (generalized): Secondary | ICD-10-CM | POA: Diagnosis not present

## 2023-11-13 DIAGNOSIS — M25612 Stiffness of left shoulder, not elsewhere classified: Secondary | ICD-10-CM | POA: Diagnosis not present

## 2023-11-13 DIAGNOSIS — R293 Abnormal posture: Secondary | ICD-10-CM | POA: Diagnosis not present

## 2023-11-16 DIAGNOSIS — M19012 Primary osteoarthritis, left shoulder: Secondary | ICD-10-CM | POA: Diagnosis not present

## 2023-11-20 DIAGNOSIS — G20C Parkinsonism, unspecified: Secondary | ICD-10-CM | POA: Diagnosis not present

## 2023-11-20 DIAGNOSIS — M25512 Pain in left shoulder: Secondary | ICD-10-CM | POA: Diagnosis not present

## 2023-11-20 DIAGNOSIS — R293 Abnormal posture: Secondary | ICD-10-CM | POA: Diagnosis not present

## 2023-11-20 DIAGNOSIS — M6281 Muscle weakness (generalized): Secondary | ICD-10-CM | POA: Diagnosis not present

## 2023-11-20 DIAGNOSIS — M25612 Stiffness of left shoulder, not elsewhere classified: Secondary | ICD-10-CM | POA: Diagnosis not present

## 2023-11-22 DIAGNOSIS — G20C Parkinsonism, unspecified: Secondary | ICD-10-CM | POA: Diagnosis not present

## 2023-11-22 DIAGNOSIS — M25512 Pain in left shoulder: Secondary | ICD-10-CM | POA: Diagnosis not present

## 2023-11-22 DIAGNOSIS — R293 Abnormal posture: Secondary | ICD-10-CM | POA: Diagnosis not present

## 2023-11-22 DIAGNOSIS — M25612 Stiffness of left shoulder, not elsewhere classified: Secondary | ICD-10-CM | POA: Diagnosis not present

## 2023-11-22 DIAGNOSIS — M6281 Muscle weakness (generalized): Secondary | ICD-10-CM | POA: Diagnosis not present

## 2023-12-04 DIAGNOSIS — M6281 Muscle weakness (generalized): Secondary | ICD-10-CM | POA: Diagnosis not present

## 2023-12-04 DIAGNOSIS — G20C Parkinsonism, unspecified: Secondary | ICD-10-CM | POA: Diagnosis not present

## 2023-12-04 DIAGNOSIS — M25512 Pain in left shoulder: Secondary | ICD-10-CM | POA: Diagnosis not present

## 2023-12-04 DIAGNOSIS — R293 Abnormal posture: Secondary | ICD-10-CM | POA: Diagnosis not present

## 2023-12-04 DIAGNOSIS — M25612 Stiffness of left shoulder, not elsewhere classified: Secondary | ICD-10-CM | POA: Diagnosis not present

## 2023-12-06 DIAGNOSIS — M6281 Muscle weakness (generalized): Secondary | ICD-10-CM | POA: Diagnosis not present

## 2023-12-06 DIAGNOSIS — G20C Parkinsonism, unspecified: Secondary | ICD-10-CM | POA: Diagnosis not present

## 2023-12-06 DIAGNOSIS — M25612 Stiffness of left shoulder, not elsewhere classified: Secondary | ICD-10-CM | POA: Diagnosis not present

## 2023-12-06 DIAGNOSIS — M25512 Pain in left shoulder: Secondary | ICD-10-CM | POA: Diagnosis not present

## 2023-12-06 DIAGNOSIS — R293 Abnormal posture: Secondary | ICD-10-CM | POA: Diagnosis not present

## 2023-12-28 DIAGNOSIS — G20C Parkinsonism, unspecified: Secondary | ICD-10-CM | POA: Diagnosis not present

## 2023-12-28 DIAGNOSIS — M25512 Pain in left shoulder: Secondary | ICD-10-CM | POA: Diagnosis not present

## 2023-12-28 DIAGNOSIS — M25612 Stiffness of left shoulder, not elsewhere classified: Secondary | ICD-10-CM | POA: Diagnosis not present

## 2023-12-28 DIAGNOSIS — M6281 Muscle weakness (generalized): Secondary | ICD-10-CM | POA: Diagnosis not present

## 2023-12-28 DIAGNOSIS — R293 Abnormal posture: Secondary | ICD-10-CM | POA: Diagnosis not present

## 2024-01-01 DIAGNOSIS — M25512 Pain in left shoulder: Secondary | ICD-10-CM | POA: Diagnosis not present

## 2024-01-01 DIAGNOSIS — G20C Parkinsonism, unspecified: Secondary | ICD-10-CM | POA: Diagnosis not present

## 2024-01-01 DIAGNOSIS — M25612 Stiffness of left shoulder, not elsewhere classified: Secondary | ICD-10-CM | POA: Diagnosis not present

## 2024-01-01 DIAGNOSIS — M6281 Muscle weakness (generalized): Secondary | ICD-10-CM | POA: Diagnosis not present

## 2024-01-01 DIAGNOSIS — R293 Abnormal posture: Secondary | ICD-10-CM | POA: Diagnosis not present

## 2024-01-02 DIAGNOSIS — M19012 Primary osteoarthritis, left shoulder: Secondary | ICD-10-CM | POA: Diagnosis not present

## 2024-01-03 DIAGNOSIS — R293 Abnormal posture: Secondary | ICD-10-CM | POA: Diagnosis not present

## 2024-01-03 DIAGNOSIS — M25512 Pain in left shoulder: Secondary | ICD-10-CM | POA: Diagnosis not present

## 2024-01-03 DIAGNOSIS — M6281 Muscle weakness (generalized): Secondary | ICD-10-CM | POA: Diagnosis not present

## 2024-01-03 DIAGNOSIS — M25612 Stiffness of left shoulder, not elsewhere classified: Secondary | ICD-10-CM | POA: Diagnosis not present

## 2024-01-03 DIAGNOSIS — G20C Parkinsonism, unspecified: Secondary | ICD-10-CM | POA: Diagnosis not present

## 2024-01-08 DIAGNOSIS — M25512 Pain in left shoulder: Secondary | ICD-10-CM | POA: Diagnosis not present

## 2024-01-08 DIAGNOSIS — G20C Parkinsonism, unspecified: Secondary | ICD-10-CM | POA: Diagnosis not present

## 2024-01-08 DIAGNOSIS — M25612 Stiffness of left shoulder, not elsewhere classified: Secondary | ICD-10-CM | POA: Diagnosis not present

## 2024-01-08 DIAGNOSIS — M6281 Muscle weakness (generalized): Secondary | ICD-10-CM | POA: Diagnosis not present

## 2024-01-08 DIAGNOSIS — R293 Abnormal posture: Secondary | ICD-10-CM | POA: Diagnosis not present

## 2024-01-10 DIAGNOSIS — M25512 Pain in left shoulder: Secondary | ICD-10-CM | POA: Diagnosis not present

## 2024-01-10 DIAGNOSIS — R293 Abnormal posture: Secondary | ICD-10-CM | POA: Diagnosis not present

## 2024-01-10 DIAGNOSIS — M6281 Muscle weakness (generalized): Secondary | ICD-10-CM | POA: Diagnosis not present

## 2024-01-10 DIAGNOSIS — M25612 Stiffness of left shoulder, not elsewhere classified: Secondary | ICD-10-CM | POA: Diagnosis not present

## 2024-01-10 DIAGNOSIS — G20C Parkinsonism, unspecified: Secondary | ICD-10-CM | POA: Diagnosis not present

## 2024-01-15 DIAGNOSIS — M25612 Stiffness of left shoulder, not elsewhere classified: Secondary | ICD-10-CM | POA: Diagnosis not present

## 2024-01-15 DIAGNOSIS — M25512 Pain in left shoulder: Secondary | ICD-10-CM | POA: Diagnosis not present

## 2024-01-15 DIAGNOSIS — R293 Abnormal posture: Secondary | ICD-10-CM | POA: Diagnosis not present

## 2024-01-15 DIAGNOSIS — G20C Parkinsonism, unspecified: Secondary | ICD-10-CM | POA: Diagnosis not present

## 2024-01-15 DIAGNOSIS — M6281 Muscle weakness (generalized): Secondary | ICD-10-CM | POA: Diagnosis not present

## 2024-01-17 DIAGNOSIS — R293 Abnormal posture: Secondary | ICD-10-CM | POA: Diagnosis not present

## 2024-01-17 DIAGNOSIS — G20C Parkinsonism, unspecified: Secondary | ICD-10-CM | POA: Diagnosis not present

## 2024-01-17 DIAGNOSIS — M25512 Pain in left shoulder: Secondary | ICD-10-CM | POA: Diagnosis not present

## 2024-01-17 DIAGNOSIS — M6281 Muscle weakness (generalized): Secondary | ICD-10-CM | POA: Diagnosis not present

## 2024-01-17 DIAGNOSIS — M25612 Stiffness of left shoulder, not elsewhere classified: Secondary | ICD-10-CM | POA: Diagnosis not present

## 2024-01-18 DIAGNOSIS — E7849 Other hyperlipidemia: Secondary | ICD-10-CM | POA: Diagnosis not present

## 2024-01-18 DIAGNOSIS — N183 Chronic kidney disease, stage 3 unspecified: Secondary | ICD-10-CM | POA: Diagnosis not present

## 2024-01-18 DIAGNOSIS — Z1389 Encounter for screening for other disorder: Secondary | ICD-10-CM | POA: Diagnosis not present

## 2024-01-18 DIAGNOSIS — Z125 Encounter for screening for malignant neoplasm of prostate: Secondary | ICD-10-CM | POA: Diagnosis not present

## 2024-01-18 DIAGNOSIS — Z0001 Encounter for general adult medical examination with abnormal findings: Secondary | ICD-10-CM | POA: Diagnosis not present

## 2024-01-18 DIAGNOSIS — Z1329 Encounter for screening for other suspected endocrine disorder: Secondary | ICD-10-CM | POA: Diagnosis not present

## 2024-01-18 DIAGNOSIS — Z6825 Body mass index (BMI) 25.0-25.9, adult: Secondary | ICD-10-CM | POA: Diagnosis not present

## 2024-01-18 DIAGNOSIS — Z1331 Encounter for screening for depression: Secondary | ICD-10-CM | POA: Diagnosis not present

## 2024-01-22 DIAGNOSIS — G20C Parkinsonism, unspecified: Secondary | ICD-10-CM | POA: Diagnosis not present

## 2024-01-22 DIAGNOSIS — M6281 Muscle weakness (generalized): Secondary | ICD-10-CM | POA: Diagnosis not present

## 2024-01-22 DIAGNOSIS — M25512 Pain in left shoulder: Secondary | ICD-10-CM | POA: Diagnosis not present

## 2024-01-22 DIAGNOSIS — R293 Abnormal posture: Secondary | ICD-10-CM | POA: Diagnosis not present

## 2024-01-22 DIAGNOSIS — M25612 Stiffness of left shoulder, not elsewhere classified: Secondary | ICD-10-CM | POA: Diagnosis not present

## 2024-01-24 DIAGNOSIS — M25612 Stiffness of left shoulder, not elsewhere classified: Secondary | ICD-10-CM | POA: Diagnosis not present

## 2024-01-24 DIAGNOSIS — G20C Parkinsonism, unspecified: Secondary | ICD-10-CM | POA: Diagnosis not present

## 2024-01-24 DIAGNOSIS — M6281 Muscle weakness (generalized): Secondary | ICD-10-CM | POA: Diagnosis not present

## 2024-01-24 DIAGNOSIS — M25512 Pain in left shoulder: Secondary | ICD-10-CM | POA: Diagnosis not present

## 2024-01-24 DIAGNOSIS — R293 Abnormal posture: Secondary | ICD-10-CM | POA: Diagnosis not present

## 2024-01-25 DIAGNOSIS — E7849 Other hyperlipidemia: Secondary | ICD-10-CM | POA: Diagnosis not present

## 2024-01-25 DIAGNOSIS — Z6824 Body mass index (BMI) 24.0-24.9, adult: Secondary | ICD-10-CM | POA: Diagnosis not present

## 2024-01-25 DIAGNOSIS — E782 Mixed hyperlipidemia: Secondary | ICD-10-CM | POA: Diagnosis not present

## 2024-01-25 DIAGNOSIS — K2100 Gastro-esophageal reflux disease with esophagitis, without bleeding: Secondary | ICD-10-CM | POA: Diagnosis not present

## 2024-01-25 DIAGNOSIS — G4733 Obstructive sleep apnea (adult) (pediatric): Secondary | ICD-10-CM | POA: Diagnosis not present

## 2024-01-25 DIAGNOSIS — N401 Enlarged prostate with lower urinary tract symptoms: Secondary | ICD-10-CM | POA: Diagnosis not present

## 2024-01-25 DIAGNOSIS — Z0001 Encounter for general adult medical examination with abnormal findings: Secondary | ICD-10-CM | POA: Diagnosis not present

## 2024-01-30 DIAGNOSIS — M6281 Muscle weakness (generalized): Secondary | ICD-10-CM | POA: Diagnosis not present

## 2024-01-30 DIAGNOSIS — R293 Abnormal posture: Secondary | ICD-10-CM | POA: Diagnosis not present

## 2024-01-30 DIAGNOSIS — G20C Parkinsonism, unspecified: Secondary | ICD-10-CM | POA: Diagnosis not present

## 2024-01-30 DIAGNOSIS — M25512 Pain in left shoulder: Secondary | ICD-10-CM | POA: Diagnosis not present

## 2024-01-30 DIAGNOSIS — M25612 Stiffness of left shoulder, not elsewhere classified: Secondary | ICD-10-CM | POA: Diagnosis not present

## 2024-02-01 DIAGNOSIS — M19012 Primary osteoarthritis, left shoulder: Secondary | ICD-10-CM | POA: Diagnosis not present

## 2024-02-05 DIAGNOSIS — M25612 Stiffness of left shoulder, not elsewhere classified: Secondary | ICD-10-CM | POA: Diagnosis not present

## 2024-02-05 DIAGNOSIS — M25512 Pain in left shoulder: Secondary | ICD-10-CM | POA: Diagnosis not present

## 2024-02-05 DIAGNOSIS — M6281 Muscle weakness (generalized): Secondary | ICD-10-CM | POA: Diagnosis not present

## 2024-02-05 DIAGNOSIS — G20C Parkinsonism, unspecified: Secondary | ICD-10-CM | POA: Diagnosis not present

## 2024-02-05 DIAGNOSIS — R293 Abnormal posture: Secondary | ICD-10-CM | POA: Diagnosis not present

## 2024-02-07 DIAGNOSIS — M6281 Muscle weakness (generalized): Secondary | ICD-10-CM | POA: Diagnosis not present

## 2024-02-07 DIAGNOSIS — G20C Parkinsonism, unspecified: Secondary | ICD-10-CM | POA: Diagnosis not present

## 2024-02-07 DIAGNOSIS — R293 Abnormal posture: Secondary | ICD-10-CM | POA: Diagnosis not present

## 2024-02-07 DIAGNOSIS — M25612 Stiffness of left shoulder, not elsewhere classified: Secondary | ICD-10-CM | POA: Diagnosis not present

## 2024-02-07 DIAGNOSIS — M25512 Pain in left shoulder: Secondary | ICD-10-CM | POA: Diagnosis not present

## 2024-02-13 DIAGNOSIS — M6281 Muscle weakness (generalized): Secondary | ICD-10-CM | POA: Diagnosis not present

## 2024-02-13 DIAGNOSIS — R293 Abnormal posture: Secondary | ICD-10-CM | POA: Diagnosis not present

## 2024-02-13 DIAGNOSIS — M25612 Stiffness of left shoulder, not elsewhere classified: Secondary | ICD-10-CM | POA: Diagnosis not present

## 2024-02-13 DIAGNOSIS — G20C Parkinsonism, unspecified: Secondary | ICD-10-CM | POA: Diagnosis not present

## 2024-02-13 DIAGNOSIS — M25512 Pain in left shoulder: Secondary | ICD-10-CM | POA: Diagnosis not present

## 2024-02-15 DIAGNOSIS — R293 Abnormal posture: Secondary | ICD-10-CM | POA: Diagnosis not present

## 2024-02-15 DIAGNOSIS — G20C Parkinsonism, unspecified: Secondary | ICD-10-CM | POA: Diagnosis not present

## 2024-02-15 DIAGNOSIS — M6281 Muscle weakness (generalized): Secondary | ICD-10-CM | POA: Diagnosis not present

## 2024-02-15 DIAGNOSIS — M25612 Stiffness of left shoulder, not elsewhere classified: Secondary | ICD-10-CM | POA: Diagnosis not present

## 2024-02-15 DIAGNOSIS — M25512 Pain in left shoulder: Secondary | ICD-10-CM | POA: Diagnosis not present

## 2024-02-19 DIAGNOSIS — G20C Parkinsonism, unspecified: Secondary | ICD-10-CM | POA: Diagnosis not present

## 2024-02-19 DIAGNOSIS — M25512 Pain in left shoulder: Secondary | ICD-10-CM | POA: Diagnosis not present

## 2024-02-19 DIAGNOSIS — M6281 Muscle weakness (generalized): Secondary | ICD-10-CM | POA: Diagnosis not present

## 2024-02-19 DIAGNOSIS — R293 Abnormal posture: Secondary | ICD-10-CM | POA: Diagnosis not present

## 2024-02-19 DIAGNOSIS — M25612 Stiffness of left shoulder, not elsewhere classified: Secondary | ICD-10-CM | POA: Diagnosis not present

## 2024-02-21 DIAGNOSIS — M25612 Stiffness of left shoulder, not elsewhere classified: Secondary | ICD-10-CM | POA: Diagnosis not present

## 2024-02-21 DIAGNOSIS — G20C Parkinsonism, unspecified: Secondary | ICD-10-CM | POA: Diagnosis not present

## 2024-02-21 DIAGNOSIS — R293 Abnormal posture: Secondary | ICD-10-CM | POA: Diagnosis not present

## 2024-02-21 DIAGNOSIS — M6281 Muscle weakness (generalized): Secondary | ICD-10-CM | POA: Diagnosis not present

## 2024-02-21 DIAGNOSIS — M25512 Pain in left shoulder: Secondary | ICD-10-CM | POA: Diagnosis not present

## 2024-02-26 DIAGNOSIS — M6281 Muscle weakness (generalized): Secondary | ICD-10-CM | POA: Diagnosis not present

## 2024-02-26 DIAGNOSIS — G20C Parkinsonism, unspecified: Secondary | ICD-10-CM | POA: Diagnosis not present

## 2024-02-26 DIAGNOSIS — M25612 Stiffness of left shoulder, not elsewhere classified: Secondary | ICD-10-CM | POA: Diagnosis not present

## 2024-02-26 DIAGNOSIS — R293 Abnormal posture: Secondary | ICD-10-CM | POA: Diagnosis not present

## 2024-02-26 DIAGNOSIS — M25512 Pain in left shoulder: Secondary | ICD-10-CM | POA: Diagnosis not present

## 2024-02-28 DIAGNOSIS — M25512 Pain in left shoulder: Secondary | ICD-10-CM | POA: Diagnosis not present

## 2024-02-28 DIAGNOSIS — M25612 Stiffness of left shoulder, not elsewhere classified: Secondary | ICD-10-CM | POA: Diagnosis not present

## 2024-02-28 DIAGNOSIS — R293 Abnormal posture: Secondary | ICD-10-CM | POA: Diagnosis not present

## 2024-02-28 DIAGNOSIS — G20C Parkinsonism, unspecified: Secondary | ICD-10-CM | POA: Diagnosis not present

## 2024-02-28 DIAGNOSIS — M6281 Muscle weakness (generalized): Secondary | ICD-10-CM | POA: Diagnosis not present

## 2024-03-04 DIAGNOSIS — R293 Abnormal posture: Secondary | ICD-10-CM | POA: Diagnosis not present

## 2024-03-04 DIAGNOSIS — M6281 Muscle weakness (generalized): Secondary | ICD-10-CM | POA: Diagnosis not present

## 2024-03-04 DIAGNOSIS — M25512 Pain in left shoulder: Secondary | ICD-10-CM | POA: Diagnosis not present

## 2024-03-04 DIAGNOSIS — M25612 Stiffness of left shoulder, not elsewhere classified: Secondary | ICD-10-CM | POA: Diagnosis not present

## 2024-03-04 DIAGNOSIS — G20C Parkinsonism, unspecified: Secondary | ICD-10-CM | POA: Diagnosis not present

## 2024-03-06 DIAGNOSIS — M25512 Pain in left shoulder: Secondary | ICD-10-CM | POA: Diagnosis not present

## 2024-03-06 DIAGNOSIS — R293 Abnormal posture: Secondary | ICD-10-CM | POA: Diagnosis not present

## 2024-03-06 DIAGNOSIS — M25612 Stiffness of left shoulder, not elsewhere classified: Secondary | ICD-10-CM | POA: Diagnosis not present

## 2024-03-06 DIAGNOSIS — G20C Parkinsonism, unspecified: Secondary | ICD-10-CM | POA: Diagnosis not present

## 2024-03-06 DIAGNOSIS — M6281 Muscle weakness (generalized): Secondary | ICD-10-CM | POA: Diagnosis not present

## 2024-03-11 DIAGNOSIS — G20C Parkinsonism, unspecified: Secondary | ICD-10-CM | POA: Diagnosis not present

## 2024-03-11 DIAGNOSIS — M25512 Pain in left shoulder: Secondary | ICD-10-CM | POA: Diagnosis not present

## 2024-03-11 DIAGNOSIS — M6281 Muscle weakness (generalized): Secondary | ICD-10-CM | POA: Diagnosis not present

## 2024-03-11 DIAGNOSIS — R293 Abnormal posture: Secondary | ICD-10-CM | POA: Diagnosis not present

## 2024-03-11 DIAGNOSIS — M25612 Stiffness of left shoulder, not elsewhere classified: Secondary | ICD-10-CM | POA: Diagnosis not present

## 2024-03-13 DIAGNOSIS — M25612 Stiffness of left shoulder, not elsewhere classified: Secondary | ICD-10-CM | POA: Diagnosis not present

## 2024-03-13 DIAGNOSIS — G20C Parkinsonism, unspecified: Secondary | ICD-10-CM | POA: Diagnosis not present

## 2024-03-13 DIAGNOSIS — R293 Abnormal posture: Secondary | ICD-10-CM | POA: Diagnosis not present

## 2024-03-13 DIAGNOSIS — M25512 Pain in left shoulder: Secondary | ICD-10-CM | POA: Diagnosis not present

## 2024-03-13 DIAGNOSIS — M6281 Muscle weakness (generalized): Secondary | ICD-10-CM | POA: Diagnosis not present

## 2024-03-18 DIAGNOSIS — M25612 Stiffness of left shoulder, not elsewhere classified: Secondary | ICD-10-CM | POA: Diagnosis not present

## 2024-03-18 DIAGNOSIS — M6281 Muscle weakness (generalized): Secondary | ICD-10-CM | POA: Diagnosis not present

## 2024-03-18 DIAGNOSIS — M25512 Pain in left shoulder: Secondary | ICD-10-CM | POA: Diagnosis not present

## 2024-03-18 DIAGNOSIS — G20C Parkinsonism, unspecified: Secondary | ICD-10-CM | POA: Diagnosis not present

## 2024-03-18 DIAGNOSIS — R293 Abnormal posture: Secondary | ICD-10-CM | POA: Diagnosis not present

## 2024-03-20 DIAGNOSIS — G20C Parkinsonism, unspecified: Secondary | ICD-10-CM | POA: Diagnosis not present

## 2024-03-20 DIAGNOSIS — M25512 Pain in left shoulder: Secondary | ICD-10-CM | POA: Diagnosis not present

## 2024-03-20 DIAGNOSIS — M6281 Muscle weakness (generalized): Secondary | ICD-10-CM | POA: Diagnosis not present

## 2024-03-20 DIAGNOSIS — M25612 Stiffness of left shoulder, not elsewhere classified: Secondary | ICD-10-CM | POA: Diagnosis not present

## 2024-03-20 DIAGNOSIS — R293 Abnormal posture: Secondary | ICD-10-CM | POA: Diagnosis not present

## 2024-03-25 DIAGNOSIS — G20C Parkinsonism, unspecified: Secondary | ICD-10-CM | POA: Diagnosis not present

## 2024-03-25 DIAGNOSIS — R293 Abnormal posture: Secondary | ICD-10-CM | POA: Diagnosis not present

## 2024-03-25 DIAGNOSIS — M6281 Muscle weakness (generalized): Secondary | ICD-10-CM | POA: Diagnosis not present

## 2024-03-25 DIAGNOSIS — M25612 Stiffness of left shoulder, not elsewhere classified: Secondary | ICD-10-CM | POA: Diagnosis not present

## 2024-03-25 DIAGNOSIS — M25512 Pain in left shoulder: Secondary | ICD-10-CM | POA: Diagnosis not present

## 2024-03-28 DIAGNOSIS — M25612 Stiffness of left shoulder, not elsewhere classified: Secondary | ICD-10-CM | POA: Diagnosis not present

## 2024-03-28 DIAGNOSIS — R293 Abnormal posture: Secondary | ICD-10-CM | POA: Diagnosis not present

## 2024-03-28 DIAGNOSIS — M25512 Pain in left shoulder: Secondary | ICD-10-CM | POA: Diagnosis not present

## 2024-03-28 DIAGNOSIS — M6281 Muscle weakness (generalized): Secondary | ICD-10-CM | POA: Diagnosis not present

## 2024-03-28 DIAGNOSIS — G20C Parkinsonism, unspecified: Secondary | ICD-10-CM | POA: Diagnosis not present

## 2024-04-01 DIAGNOSIS — M6281 Muscle weakness (generalized): Secondary | ICD-10-CM | POA: Diagnosis not present

## 2024-04-01 DIAGNOSIS — M25512 Pain in left shoulder: Secondary | ICD-10-CM | POA: Diagnosis not present

## 2024-04-01 DIAGNOSIS — G20C Parkinsonism, unspecified: Secondary | ICD-10-CM | POA: Diagnosis not present

## 2024-04-01 DIAGNOSIS — M25612 Stiffness of left shoulder, not elsewhere classified: Secondary | ICD-10-CM | POA: Diagnosis not present

## 2024-04-01 DIAGNOSIS — R293 Abnormal posture: Secondary | ICD-10-CM | POA: Diagnosis not present

## 2024-04-03 DIAGNOSIS — M25612 Stiffness of left shoulder, not elsewhere classified: Secondary | ICD-10-CM | POA: Diagnosis not present

## 2024-04-03 DIAGNOSIS — R293 Abnormal posture: Secondary | ICD-10-CM | POA: Diagnosis not present

## 2024-04-03 DIAGNOSIS — M25512 Pain in left shoulder: Secondary | ICD-10-CM | POA: Diagnosis not present

## 2024-04-03 DIAGNOSIS — G20C Parkinsonism, unspecified: Secondary | ICD-10-CM | POA: Diagnosis not present

## 2024-04-03 DIAGNOSIS — M6281 Muscle weakness (generalized): Secondary | ICD-10-CM | POA: Diagnosis not present

## 2024-04-08 DIAGNOSIS — M6281 Muscle weakness (generalized): Secondary | ICD-10-CM | POA: Diagnosis not present

## 2024-04-08 DIAGNOSIS — M25612 Stiffness of left shoulder, not elsewhere classified: Secondary | ICD-10-CM | POA: Diagnosis not present

## 2024-04-08 DIAGNOSIS — M25512 Pain in left shoulder: Secondary | ICD-10-CM | POA: Diagnosis not present

## 2024-04-08 DIAGNOSIS — G20C Parkinsonism, unspecified: Secondary | ICD-10-CM | POA: Diagnosis not present

## 2024-04-08 DIAGNOSIS — R293 Abnormal posture: Secondary | ICD-10-CM | POA: Diagnosis not present

## 2024-04-10 DIAGNOSIS — R350 Frequency of micturition: Secondary | ICD-10-CM | POA: Diagnosis not present

## 2024-04-11 DIAGNOSIS — R293 Abnormal posture: Secondary | ICD-10-CM | POA: Diagnosis not present

## 2024-04-11 DIAGNOSIS — G20C Parkinsonism, unspecified: Secondary | ICD-10-CM | POA: Diagnosis not present

## 2024-04-11 DIAGNOSIS — M25512 Pain in left shoulder: Secondary | ICD-10-CM | POA: Diagnosis not present

## 2024-04-11 DIAGNOSIS — M6281 Muscle weakness (generalized): Secondary | ICD-10-CM | POA: Diagnosis not present

## 2024-04-15 DIAGNOSIS — M6281 Muscle weakness (generalized): Secondary | ICD-10-CM | POA: Diagnosis not present

## 2024-04-15 DIAGNOSIS — M25512 Pain in left shoulder: Secondary | ICD-10-CM | POA: Diagnosis not present

## 2024-04-15 DIAGNOSIS — G20C Parkinsonism, unspecified: Secondary | ICD-10-CM | POA: Diagnosis not present

## 2024-04-15 DIAGNOSIS — R293 Abnormal posture: Secondary | ICD-10-CM | POA: Diagnosis not present

## 2024-04-15 DIAGNOSIS — M25612 Stiffness of left shoulder, not elsewhere classified: Secondary | ICD-10-CM | POA: Diagnosis not present

## 2024-04-17 DIAGNOSIS — M25512 Pain in left shoulder: Secondary | ICD-10-CM | POA: Diagnosis not present

## 2024-04-17 DIAGNOSIS — M6281 Muscle weakness (generalized): Secondary | ICD-10-CM | POA: Diagnosis not present

## 2024-04-17 DIAGNOSIS — G20C Parkinsonism, unspecified: Secondary | ICD-10-CM | POA: Diagnosis not present

## 2024-04-17 DIAGNOSIS — M25612 Stiffness of left shoulder, not elsewhere classified: Secondary | ICD-10-CM | POA: Diagnosis not present

## 2024-04-17 DIAGNOSIS — R293 Abnormal posture: Secondary | ICD-10-CM | POA: Diagnosis not present

## 2024-04-22 DIAGNOSIS — M25612 Stiffness of left shoulder, not elsewhere classified: Secondary | ICD-10-CM | POA: Diagnosis not present

## 2024-04-22 DIAGNOSIS — M25512 Pain in left shoulder: Secondary | ICD-10-CM | POA: Diagnosis not present

## 2024-04-22 DIAGNOSIS — R293 Abnormal posture: Secondary | ICD-10-CM | POA: Diagnosis not present

## 2024-04-22 DIAGNOSIS — M6281 Muscle weakness (generalized): Secondary | ICD-10-CM | POA: Diagnosis not present

## 2024-04-22 DIAGNOSIS — G20C Parkinsonism, unspecified: Secondary | ICD-10-CM | POA: Diagnosis not present

## 2024-04-23 DIAGNOSIS — J069 Acute upper respiratory infection, unspecified: Secondary | ICD-10-CM | POA: Diagnosis not present

## 2024-04-23 DIAGNOSIS — Z6826 Body mass index (BMI) 26.0-26.9, adult: Secondary | ICD-10-CM | POA: Diagnosis not present

## 2024-04-23 DIAGNOSIS — R059 Cough, unspecified: Secondary | ICD-10-CM | POA: Diagnosis not present

## 2024-04-28 DIAGNOSIS — M25612 Stiffness of left shoulder, not elsewhere classified: Secondary | ICD-10-CM | POA: Diagnosis not present

## 2024-04-28 DIAGNOSIS — G20C Parkinsonism, unspecified: Secondary | ICD-10-CM | POA: Diagnosis not present

## 2024-04-28 DIAGNOSIS — M6281 Muscle weakness (generalized): Secondary | ICD-10-CM | POA: Diagnosis not present

## 2024-04-28 DIAGNOSIS — R293 Abnormal posture: Secondary | ICD-10-CM | POA: Diagnosis not present

## 2024-04-28 DIAGNOSIS — M25512 Pain in left shoulder: Secondary | ICD-10-CM | POA: Diagnosis not present

## 2024-05-01 DIAGNOSIS — G20C Parkinsonism, unspecified: Secondary | ICD-10-CM | POA: Diagnosis not present

## 2024-05-01 DIAGNOSIS — R293 Abnormal posture: Secondary | ICD-10-CM | POA: Diagnosis not present

## 2024-05-01 DIAGNOSIS — M25512 Pain in left shoulder: Secondary | ICD-10-CM | POA: Diagnosis not present

## 2024-05-01 DIAGNOSIS — M25612 Stiffness of left shoulder, not elsewhere classified: Secondary | ICD-10-CM | POA: Diagnosis not present

## 2024-05-01 DIAGNOSIS — M6281 Muscle weakness (generalized): Secondary | ICD-10-CM | POA: Diagnosis not present

## 2024-05-06 DIAGNOSIS — R3912 Poor urinary stream: Secondary | ICD-10-CM | POA: Diagnosis not present

## 2024-05-06 DIAGNOSIS — R351 Nocturia: Secondary | ICD-10-CM | POA: Diagnosis not present

## 2024-05-06 DIAGNOSIS — R350 Frequency of micturition: Secondary | ICD-10-CM | POA: Diagnosis not present

## 2024-05-06 DIAGNOSIS — R3914 Feeling of incomplete bladder emptying: Secondary | ICD-10-CM | POA: Diagnosis not present

## 2024-05-06 DIAGNOSIS — R3915 Urgency of urination: Secondary | ICD-10-CM | POA: Diagnosis not present

## 2024-05-06 DIAGNOSIS — N5201 Erectile dysfunction due to arterial insufficiency: Secondary | ICD-10-CM | POA: Diagnosis not present
